# Patient Record
Sex: Female | Born: 1950 | Race: White | Hispanic: No | Marital: Married | Smoking: Never smoker
Health system: Southern US, Community
[De-identification: ages and names within clinical notes are randomized; demographics above are authoritative.]

## PROBLEM LIST (undated history)

## (undated) DIAGNOSIS — K219 Gastro-esophageal reflux disease without esophagitis: Secondary | ICD-10-CM

## (undated) DIAGNOSIS — D649 Anemia, unspecified: Secondary | ICD-10-CM

## (undated) DIAGNOSIS — M1990 Unspecified osteoarthritis, unspecified site: Secondary | ICD-10-CM

## (undated) DIAGNOSIS — M4802 Spinal stenosis, cervical region: Secondary | ICD-10-CM

## (undated) DIAGNOSIS — G43909 Migraine, unspecified, not intractable, without status migrainosus: Secondary | ICD-10-CM

## (undated) DIAGNOSIS — N6019 Diffuse cystic mastopathy of unspecified breast: Secondary | ICD-10-CM

## (undated) DIAGNOSIS — E538 Deficiency of other specified B group vitamins: Secondary | ICD-10-CM

## (undated) DIAGNOSIS — E669 Obesity, unspecified: Secondary | ICD-10-CM

## (undated) DIAGNOSIS — T7840XA Allergy, unspecified, initial encounter: Secondary | ICD-10-CM

## (undated) DIAGNOSIS — E785 Hyperlipidemia, unspecified: Secondary | ICD-10-CM

## (undated) DIAGNOSIS — F419 Anxiety disorder, unspecified: Secondary | ICD-10-CM

## (undated) HISTORY — PX: TUBAL LIGATION: SHX77

## (undated) HISTORY — DX: Unspecified osteoarthritis, unspecified site: M19.90

## (undated) HISTORY — DX: Anemia, unspecified: D64.9

## (undated) HISTORY — DX: Gastro-esophageal reflux disease without esophagitis: K21.9

## (undated) HISTORY — DX: Spinal stenosis, cervical region: M48.02

## (undated) HISTORY — DX: Allergy, unspecified, initial encounter: T78.40XA

## (undated) HISTORY — DX: Deficiency of other specified B group vitamins: E53.8

## (undated) HISTORY — DX: Diffuse cystic mastopathy of unspecified breast: N60.19

## (undated) HISTORY — DX: Migraine, unspecified, not intractable, without status migrainosus: G43.909

## (undated) HISTORY — DX: Obesity, unspecified: E66.9

## (undated) HISTORY — DX: Anxiety disorder, unspecified: F41.9

## (undated) HISTORY — PX: APPENDECTOMY: SHX54

## (undated) HISTORY — DX: Hyperlipidemia, unspecified: E78.5

---

## 1979-10-31 HISTORY — PX: CHOLECYSTECTOMY OPEN: SUR202

## 1986-10-30 HISTORY — PX: ABDOMINAL HYSTERECTOMY: SHX81

## 2000-01-05 ENCOUNTER — Other Ambulatory Visit: Admission: RE | Admit: 2000-01-05 | Discharge: 2000-01-05 | Payer: Self-pay | Admitting: Obstetrics and Gynecology

## 2001-09-05 ENCOUNTER — Encounter: Payer: Self-pay | Admitting: Family Medicine

## 2001-09-05 ENCOUNTER — Ambulatory Visit (HOSPITAL_COMMUNITY): Admission: RE | Admit: 2001-09-05 | Discharge: 2001-09-05 | Payer: Self-pay | Admitting: Family Medicine

## 2003-04-03 ENCOUNTER — Other Ambulatory Visit: Admission: RE | Admit: 2003-04-03 | Discharge: 2003-04-03 | Payer: Self-pay | Admitting: Obstetrics and Gynecology

## 2003-10-31 HISTORY — PX: KNEE ARTHROSCOPY: SUR90

## 2008-07-29 ENCOUNTER — Ambulatory Visit (HOSPITAL_COMMUNITY): Admission: RE | Admit: 2008-07-29 | Discharge: 2008-07-29 | Payer: Self-pay | Admitting: Family Medicine

## 2008-09-11 ENCOUNTER — Ambulatory Visit (HOSPITAL_COMMUNITY): Admission: RE | Admit: 2008-09-11 | Discharge: 2008-09-11 | Payer: Self-pay | Admitting: Orthopedic Surgery

## 2008-11-25 ENCOUNTER — Ambulatory Visit (HOSPITAL_BASED_OUTPATIENT_CLINIC_OR_DEPARTMENT_OTHER): Admission: RE | Admit: 2008-11-25 | Discharge: 2008-11-25 | Payer: Self-pay | Admitting: Orthopedic Surgery

## 2009-03-06 ENCOUNTER — Emergency Department (HOSPITAL_COMMUNITY): Admission: EM | Admit: 2009-03-06 | Discharge: 2009-03-07 | Payer: Self-pay | Admitting: Emergency Medicine

## 2009-12-24 IMAGING — CT CT HEAD W/O CM
1 series · 16 of 30 positions shown, 20 images · non-contrast
Comparison: None

CLINICAL DATA: Fell.  Headache.

CT HEAD WITHOUT CONTRAST
TECHNIQUE: Contiguous axial images were obtained from the base of
the skull through the vertex without contrast.

[Series 2: headtrauma 4.8 h37s · axial · 0.43mm/px · z∈[+298,+455]mm · 16 of 36 slices shown, 20 images]
[im 2/36  brain]
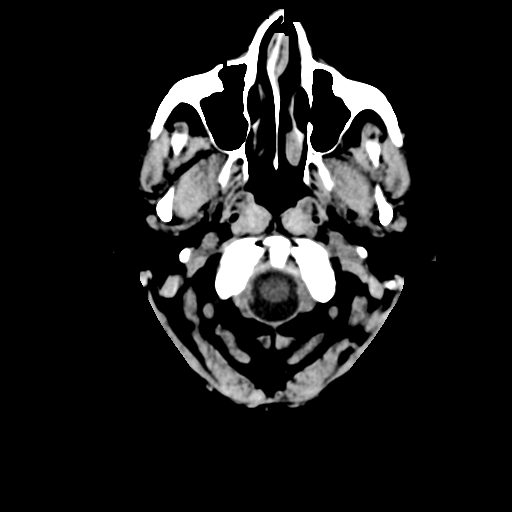
[im 2/36  bone]
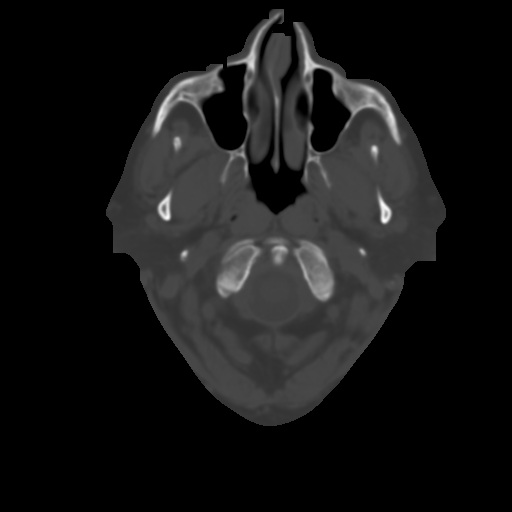
[im 4/36  brain]
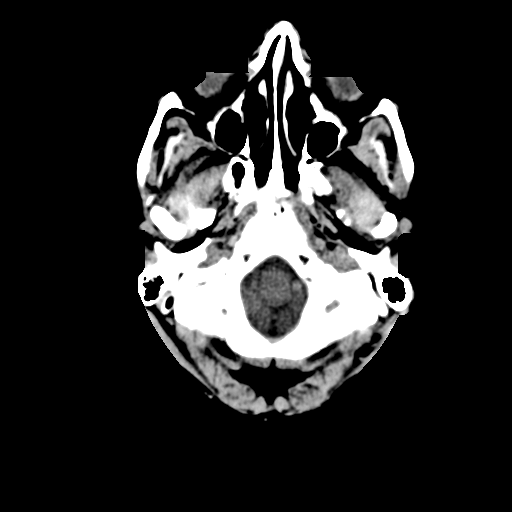
[im 7/36  brain]
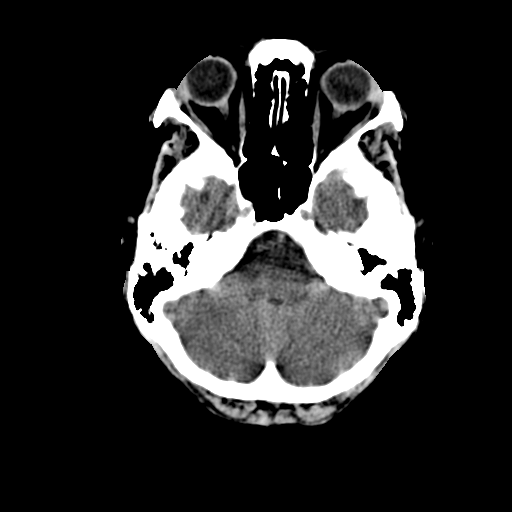
[im 9/36  brain]
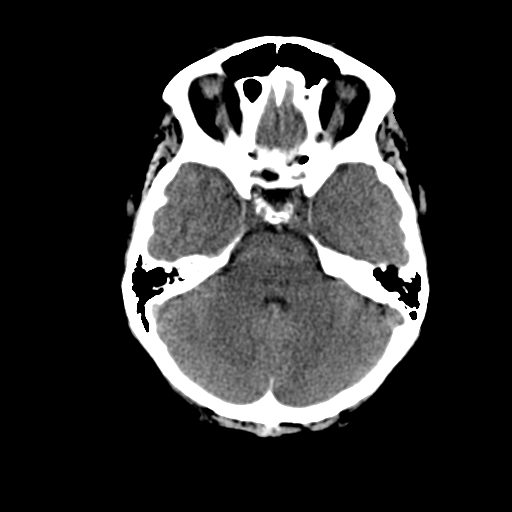
[im 10/36  brain]
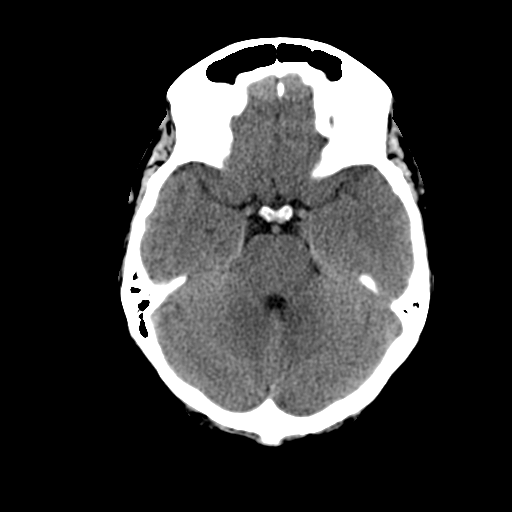
[im 10/36  bone]
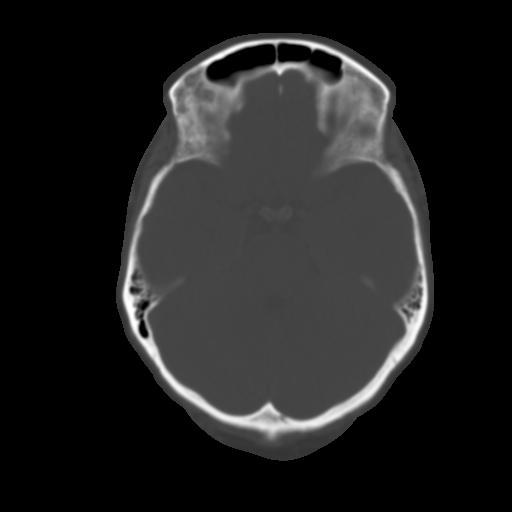
[im 13/36  brain]
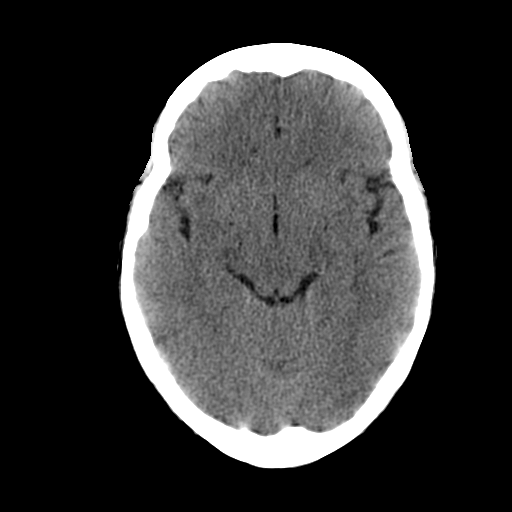
[im 15/36  brain]
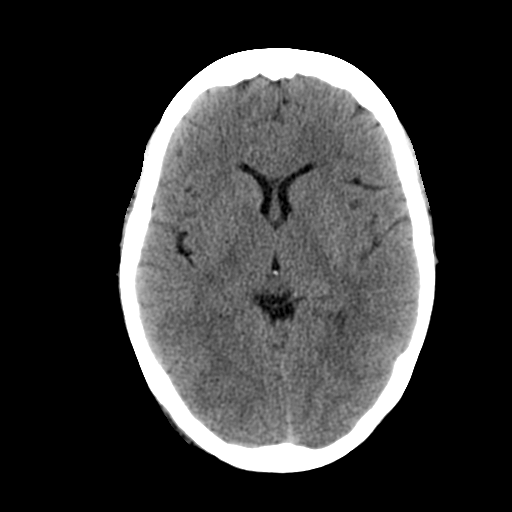
[im 17/36  brain]
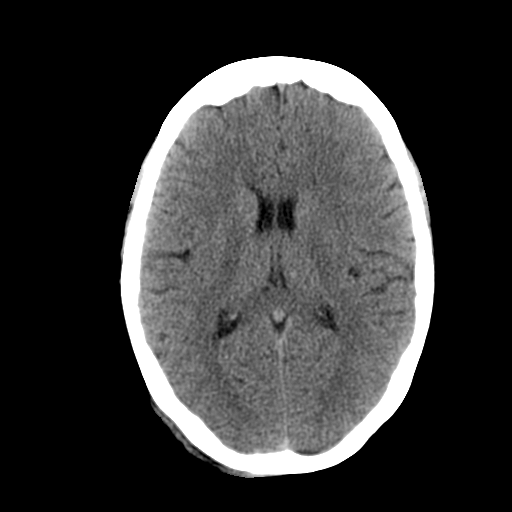
[im 19/36  brain]
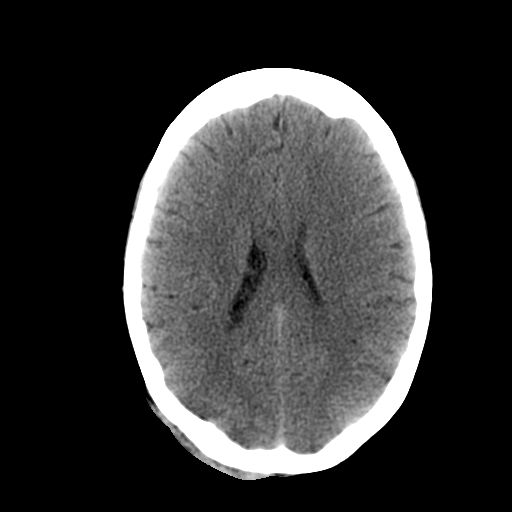
[im 19/36  bone]
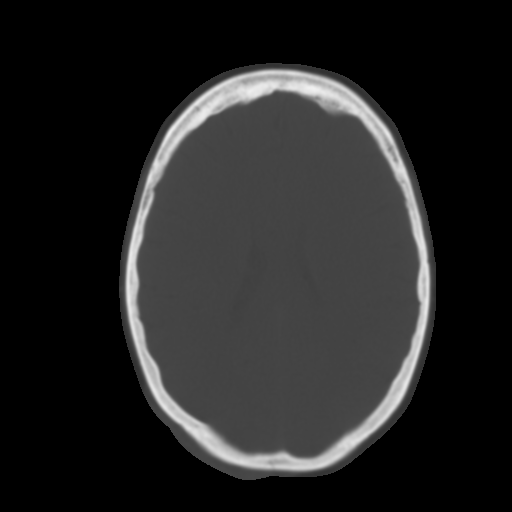
[im 21/36  brain]
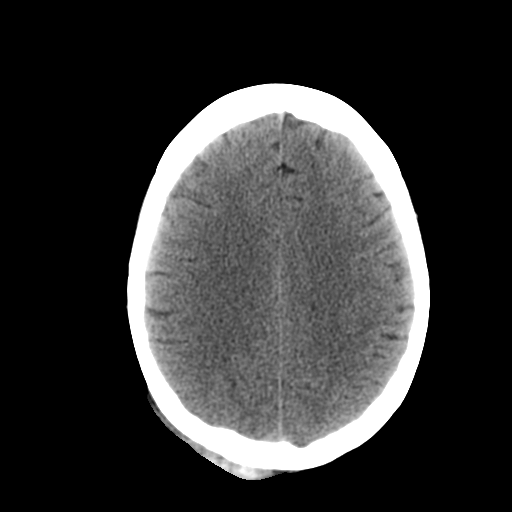
[im 23/36  brain]
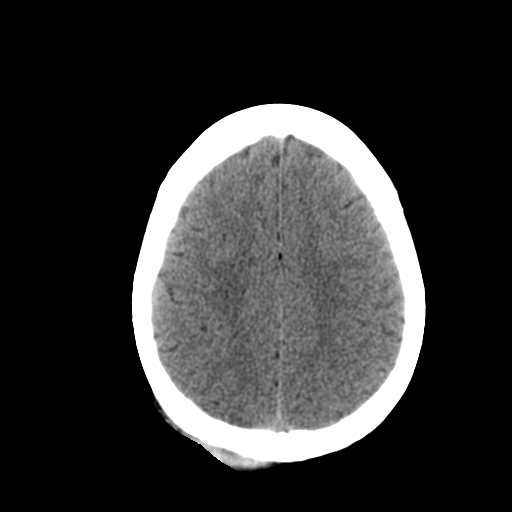
[im 26/36  brain]
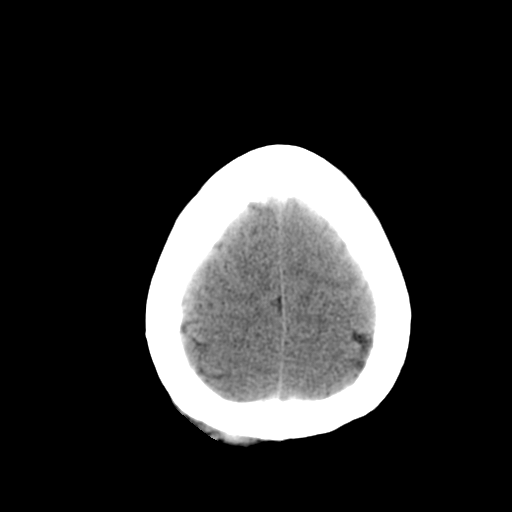
[im 27/36  brain]
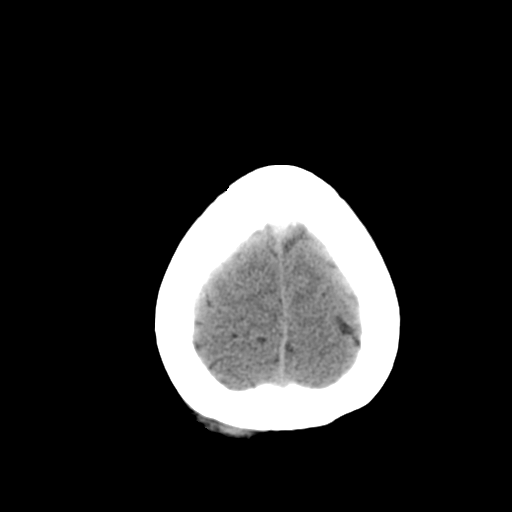
[im 27/36  bone]
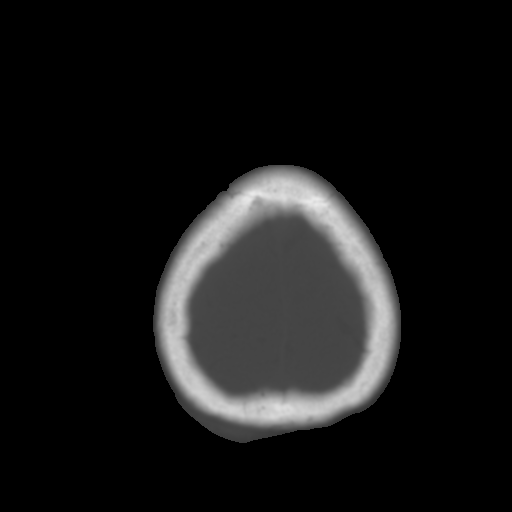
[im 29/36  brain]
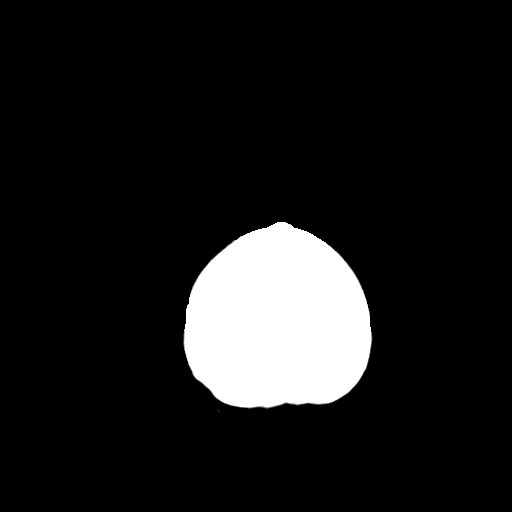
[im 32/36  brain]
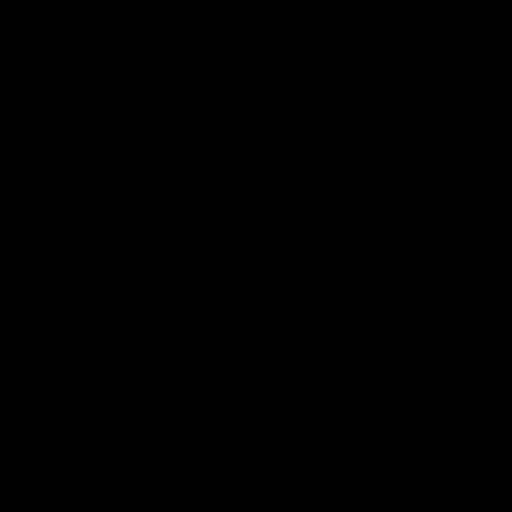
[im 34/36  brain]
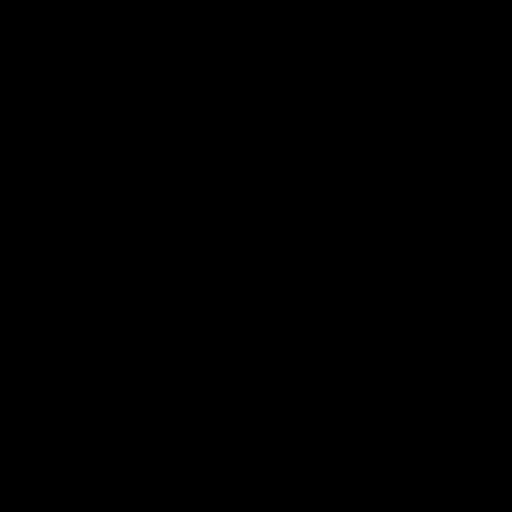

[16 of 30 positions shown; findings below may reference images not displayed]

FINDINGS: The ventricles are normal.  No extra-axial fluid
collections are seen.  The brainstem and cerebellum are
unremarkable.  No acute intracranial findings or mass lesions.

The bony calvarium is intact.  The visualized paranasal sinuses and
mastoid air cells are clear.

Right posterior parietal scalp hematoma.  No underlying skull
fracture.
IMPRESSION: 1.  No acute intracranial findings or mass lesions.
2.  Posterior parietal scalp hematoma without underlying skull
fracture.

## 2011-03-14 NOTE — Op Note (Signed)
Gina Patterson, Gina Patterson                 ACCOUNT NO.:  [id number]   MEDICAL RECORD NO.:  [id number]          PATIENT TYPE:  AMB   LOCATION:  [facility]                         FACILITY:  [facility]   PHYSICIAN:  Ollen Gross, M.D.    DATE OF BIRTH:  [birth year]   DATE OF PROCEDURE:  11/25/2008  DATE OF DISCHARGE:  11/25/2008                               OPERATIVE REPORT   PREOPERATIVE DIAGNOSIS:  Left knee medial meniscal tear.   POSTOPERATIVE DIAGNOSIS:  Left knee medial meniscal tear, plus medial  and lateral chondral defects.   PROCEDURE:  Left knee arthroscopy with medial meniscal debridement and  medial and lateral chondroplasty.   SURGEON:  Ollen Gross, M.D.   ANESTHESIA:  General.   ESTIMATED BLOOD LOSS:  Minimal.   DRAINS:  None.   COMPLICATIONS:  None.   CONDITION:  Stable to recovery room.   BRIEF CLINICAL NOTE:  Ms. Linden has had a long history of significant  left knee pain and mechanical symptoms.  These have gotten progressively  worse over time.  She did not respond well to injection and presents now  for left knee arthroscopy and debridement.   PROCEDURE IN DETAIL:  After successful administration of general  anesthetic, tourniquet was placed high on the left thigh and left lower  extremity prepped and draped in the usual sterile fashion.  Standard  superomedial and inferolateral incisions were made.  Inflow cannula  passed superomedial, camera passed inferolateral.  Arthroscopic  visualization proceeds.  Undersurface of the patella and trochlea show  minimal chondromalacia.  Medial and lateral gutters were visualized.  There were no loose bodies.  Flexion and valgus force was applied to the  knee and the medial compartment is entered.  The medial compartment does  show a chondral defect on the medial femoral condyle as well as a tear  in the body and posterior horn of the medial meniscus.  A spinal needle  is used to localize inferomedial portal, small incision made,  dilator  placed, and probe placed.  The meniscus debrided back to stable base  with baskets and a 4.2 mm shaver.  The chondral defect is debrided back  to a stable bony base with stable cartilaginous edges using the shaver.  Size of the chondral defect about 1 x 2 cm.  Intercondylar notch was  visualized and ACL looks normal.  Lateral compartment was entered and  there is a defect in the lateral femoral condyle about 1 x 1 cm.  The  cartilage was delaminating from bone.  This was debrided back to a  stable bony base with stable cartilaginous edges.  The rest of the  lateral compartment looks normal.  The joint was reinspected and there  were no other tears, loose bodies or chondral defects.  The arthroscopic  equipment was then removed from the  inferior portals which were closed with interrupted 4-0 nylon.  20 mL of  0.25% Marcaine with epi injected through the inflow cannula, then that  is removed and that portal closed with nylon.  Bulky sterile dressing  applied and she is awakened and  transferred to recovery in stable  condition.      Ollen Gross, M.D.  Electronically Signed     FA/MEDQ  D:  11/29/2008  T:  11/30/2008  Job:  [id number]

## 2012-06-28 LAB — HM PAP SMEAR: HM Pap smear: NEGATIVE

## 2012-06-30 HISTORY — PX: BREAST SURGERY: SHX581

## 2012-07-17 ENCOUNTER — Other Ambulatory Visit: Payer: Self-pay

## 2012-07-17 DIAGNOSIS — N6019 Diffuse cystic mastopathy of unspecified breast: Secondary | ICD-10-CM

## 2012-07-17 HISTORY — DX: Diffuse cystic mastopathy of unspecified breast: N60.19

## 2012-07-17 LAB — HM MAMMOGRAPHY

## 2013-01-28 ENCOUNTER — Other Ambulatory Visit: Payer: Self-pay | Admitting: Nurse Practitioner

## 2013-01-28 ENCOUNTER — Other Ambulatory Visit (INDEPENDENT_AMBULATORY_CARE_PROVIDER_SITE_OTHER): Payer: BC Managed Care – PPO

## 2013-01-28 DIAGNOSIS — E559 Vitamin D deficiency, unspecified: Secondary | ICD-10-CM

## 2013-01-29 LAB — VITAMIN D 25 HYDROXY (VIT D DEFICIENCY, FRACTURES): Vit D, 25-Hydroxy: 23 ng/mL — ABNORMAL LOW (ref 30–89)

## 2013-01-30 ENCOUNTER — Other Ambulatory Visit: Payer: Self-pay | Admitting: *Deleted

## 2013-01-30 DIAGNOSIS — E559 Vitamin D deficiency, unspecified: Secondary | ICD-10-CM

## 2013-01-30 MED ORDER — ERGOCALCIFEROL 1.25 MG (50000 UT) PO CAPS: 50000.0000 [IU] | ORAL_CAPSULE | ORAL | Status: DC

## 2013-07-08 ENCOUNTER — Encounter: Payer: Self-pay | Admitting: Nurse Practitioner

## 2013-07-09 ENCOUNTER — Ambulatory Visit (INDEPENDENT_AMBULATORY_CARE_PROVIDER_SITE_OTHER): Payer: BC Managed Care – PPO | Admitting: Nurse Practitioner

## 2013-07-09 ENCOUNTER — Encounter: Payer: Self-pay | Admitting: Nurse Practitioner

## 2013-07-09 VITALS — BP 136/92 | HR 80 | Resp 16 | Ht 65.75 in | Wt 230.0 lb

## 2013-07-09 DIAGNOSIS — Z01419 Encounter for gynecological examination (general) (routine) without abnormal findings: Secondary | ICD-10-CM

## 2013-07-09 DIAGNOSIS — E559 Vitamin D deficiency, unspecified: Secondary | ICD-10-CM | POA: Insufficient documentation

## 2013-07-09 DIAGNOSIS — Z0000 Encounter for general adult medical examination without abnormal findings: Secondary | ICD-10-CM

## 2013-07-09 DIAGNOSIS — R300 Dysuria: Secondary | ICD-10-CM

## 2013-07-09 DIAGNOSIS — E538 Deficiency of other specified B group vitamins: Secondary | ICD-10-CM

## 2013-07-09 LAB — COMPREHENSIVE METABOLIC PANEL
ALT: 20 U/L (ref 0–35)
AST: 19 U/L (ref 0–37)
CO2: 27 mEq/L (ref 19–32)
Chloride: 101 mEq/L (ref 96–112)
Creat: 0.73 mg/dL (ref 0.50–1.10)
Sodium: 135 mEq/L (ref 135–145)
Total Bilirubin: 0.5 mg/dL (ref 0.3–1.2)
Total Protein: 6.7 g/dL (ref 6.0–8.3)

## 2013-07-09 LAB — LIPID PANEL
Cholesterol: 187 mg/dL (ref 0–200)
Total CHOL/HDL Ratio: 4.7 Ratio

## 2013-07-09 LAB — HEMOGLOBIN A1C: Hgb A1c MFr Bld: 6.4 % — ABNORMAL HIGH (ref <5.7)

## 2013-07-09 LAB — HEMOGLOBIN, FINGERSTICK: Hemoglobin, fingerstick: 14.2 g/dL (ref 12.0–16.0)

## 2013-07-09 MED ORDER — NITROFURANTOIN MONOHYD MACRO 100 MG PO CAPS: 100.0000 mg | ORAL_CAPSULE | Freq: Two times a day (BID) | ORAL | Status: DC

## 2013-07-09 MED ORDER — PHENAZOPYRIDINE HCL 200 MG PO TABS: 200.0000 mg | ORAL_TABLET | Freq: Three times a day (TID) | ORAL | Status: DC | PRN

## 2013-07-09 NOTE — Progress Notes (Signed)
Patient ID: Gina Patterson, female   DOB: 01-17-1951, 62 y.o.   MRN: [id number] 62 y.o. G2P2002 Married Caucasian Fe here for annual exam.   Urinary symptoms since Monday am, with dysuria and urgency. Started on Pyridium and increase in fluids along with Macrobid on Monday.  She has now taken X 4 dose. Symptoms are better. Had left breast mass with needle core biopsy 07/17/12 showing FCB changes and apocrine metaplasia. One year follow up is due this month.  No LMP recorded. Patient is postmenopausal.          Sexually active: yes  The current method of family planning is status post hysterectomy.    Exercising: yes  walking twice a week Smoker:  no  Health Maintenance: Pap:  06/28/12, WNL HR HPV neg MMG:  07/16/12, biopsy; BI-Rads 2: benign findings Colonoscopy:  Never secondary to insurance coverage, PCP gives IFOB BMD:   07/11/12 T Score: spine 0.6/ left hip neck -1.5; total -0.2  TDaP:  UTD Labs: HB:  14.2 Urine: on pyridium     Past Medical History  Diagnosis Date  . Fibrocystic breast changes 07/17/12    Past Surgical History  Procedure Laterality Date  . Breast surgery Left 06/2012    fibrocystic breast changes    Current Outpatient Prescriptions  Medication Sig Dispense Refill  . ergocalciferol (VITAMIN D2) 50000 UNITS capsule Take 1 capsule (50,000 Units total) by mouth once a week.  26 capsule  1   No current facility-administered medications for this visit.    Family History  Problem Relation Age of Onset  . Lung cancer Mother 54  . CVA Father 40  . Lung cancer Sister 73  . Heart attack Brother   . Heart attack Brother     ROS:  Pertinent items are noted in HPI.  Otherwise, a comprehensive ROS was negative.  Exam:   BP 136/92  Pulse 80  Resp 16  Ht 5' 5.75" (1.67 m)  Wt 230 lb (104.327 kg)  BMI 37.41 kg/m2 Height: 5' 5.75" (167 cm)  Ht Readings from Last 3 Encounters:  07/09/13 5' 5.75" (1.67 m)    General appearance: alert, cooperative and appears stated  age Head: Normocephalic, without obvious abnormality, atraumatic Neck: no adenopathy, supple, symmetrical, trachea midline and thyroid normal to inspection and palpation Lungs: clear to auscultation bilaterally Breasts: normal appearance, no masses or tenderness, some slight tenderness at site of core biopsy without mass Heart: regular rate and rhythm Abdomen: soft, non-tender; no masses,  no organomegaly Extremities: extremities normal, atraumatic, no cyanosis or edema Skin: Skin color, texture, turgor normal. No rashes or lesions Lymph nodes: Cervical, supraclavicular, and axillary nodes normal. No abnormal inguinal nodes palpated Neurologic: Grossly normal   Pelvic: External genitalia:  no lesions              Urethra:  normal appearing urethra with no masses, tenderness or lesions              Bartholin's and Skene's: normal                 Vagina: normal appearing vagina with normal color and discharge, no lesions              Cervix: absent              Pap taken: no Bimanual Exam:  Uterus:  uterus absent              Adnexa: no mass, fullness, tenderness  Rectovaginal: Confirms               Anus:  normal sphincter tone, no lesions  A:  Well Woman with normal exam  S/P TAH secondary to fibroids and DUB 1988  S/P Left Breast Biopsy 06/2012 with FCB changes  Obesity, history  of Vit B 12  And vit D deficiency    P:   Pap smear as per guidelines - not indicated   Mammogram due 9/14  R/O UTI - partially treated and will complete therapy even if UA is negative.  Will follow with labs  Vit B 12 and rest of labs for patient to have for PCP appointment  Counseled on breast self exam, osteoporosis, adequate intake of calcium and vitamin D, diet and exercise, Kegel's exercises return annually or prn  An After Visit Summary was printed and given to the patient.

## 2013-07-09 NOTE — Patient Instructions (Signed)

## 2013-07-10 LAB — URINE CULTURE: Organism ID, Bacteria: NO GROWTH

## 2013-07-10 LAB — VITAMIN B12: Vitamin B-12: 370 pg/mL (ref 211–911)

## 2013-07-10 LAB — URINALYSIS, MICROSCOPIC ONLY
Bacteria, UA: NONE SEEN
Squamous Epithelial / LPF: NONE SEEN

## 2013-07-10 LAB — VITAMIN D 25 HYDROXY (VIT D DEFICIENCY, FRACTURES): Vit D, 25-Hydroxy: 37 ng/mL (ref 30–89)

## 2013-07-10 LAB — FOLATE: Folate: 7.2 ng/mL

## 2013-07-10 LAB — TSH: TSH: 5.017 u[IU]/mL — ABNORMAL HIGH (ref 0.350–4.500)

## 2013-07-10 NOTE — Progress Notes (Signed)
Encounter reviewed by Dr. Myron Lona Silva.  

## 2013-09-04 ENCOUNTER — Other Ambulatory Visit: Payer: Self-pay

## 2013-09-10 ENCOUNTER — Encounter: Payer: Self-pay | Admitting: Nurse Practitioner

## 2014-07-15 ENCOUNTER — Ambulatory Visit: Payer: BC Managed Care – PPO | Admitting: Nurse Practitioner

## 2014-07-16 ENCOUNTER — Ambulatory Visit (INDEPENDENT_AMBULATORY_CARE_PROVIDER_SITE_OTHER): Payer: BC Managed Care – PPO | Admitting: Nurse Practitioner

## 2014-07-16 ENCOUNTER — Encounter: Payer: Self-pay | Admitting: Nurse Practitioner

## 2014-07-16 ENCOUNTER — Other Ambulatory Visit: Payer: Self-pay | Admitting: Nurse Practitioner

## 2014-07-16 VITALS — BP 116/76 | HR 76 | Ht 65.5 in | Wt 235.0 lb

## 2014-07-16 DIAGNOSIS — M8580 Other specified disorders of bone density and structure, unspecified site: Secondary | ICD-10-CM

## 2014-07-16 DIAGNOSIS — Z01419 Encounter for gynecological examination (general) (routine) without abnormal findings: Secondary | ICD-10-CM

## 2014-07-16 DIAGNOSIS — M949 Disorder of cartilage, unspecified: Secondary | ICD-10-CM

## 2014-07-16 DIAGNOSIS — E538 Deficiency of other specified B group vitamins: Secondary | ICD-10-CM

## 2014-07-16 DIAGNOSIS — E559 Vitamin D deficiency, unspecified: Secondary | ICD-10-CM

## 2014-07-16 DIAGNOSIS — M899 Disorder of bone, unspecified: Secondary | ICD-10-CM

## 2014-07-16 DIAGNOSIS — Z0000 Encounter for general adult medical examination without abnormal findings: Secondary | ICD-10-CM

## 2014-07-16 DIAGNOSIS — R82998 Other abnormal findings in urine: Secondary | ICD-10-CM

## 2014-07-16 DIAGNOSIS — R8290 Unspecified abnormal findings in urine: Secondary | ICD-10-CM

## 2014-07-16 LAB — COMPREHENSIVE METABOLIC PANEL
ALK PHOS: 76 U/L (ref 39–117)
ALT: 21 U/L (ref 0–35)
AST: 16 U/L (ref 0–37)
Albumin: 4 g/dL (ref 3.5–5.2)
BILIRUBIN TOTAL: 0.5 mg/dL (ref 0.2–1.2)
BUN: 13 mg/dL (ref 6–23)
CALCIUM: 9 mg/dL (ref 8.4–10.5)
CHLORIDE: 102 meq/L (ref 96–112)
CO2: 25 mEq/L (ref 19–32)
CREATININE: 0.79 mg/dL (ref 0.50–1.10)
Glucose, Bld: 116 mg/dL — ABNORMAL HIGH (ref 70–99)
Potassium: 4.6 mEq/L (ref 3.5–5.3)
Sodium: 137 mEq/L (ref 135–145)
Total Protein: 6.5 g/dL (ref 6.0–8.3)

## 2014-07-16 LAB — HEMOGLOBIN A1C
Hgb A1c MFr Bld: 6.8 % — ABNORMAL HIGH (ref <5.7)
Mean Plasma Glucose: 148 mg/dL — ABNORMAL HIGH (ref <117)

## 2014-07-16 LAB — LIPID PANEL
CHOL/HDL RATIO: 4.2 ratio
CHOLESTEROL: 182 mg/dL (ref 0–200)
HDL: 43 mg/dL (ref >39)
LDL CALC: 97 mg/dL (ref 0–99)
TRIGLYCERIDES: 210 mg/dL — AB (ref <150)
VLDL: 42 mg/dL — AB (ref 0–40)

## 2014-07-16 LAB — POCT URINALYSIS DIPSTICK
BILIRUBIN UA: NEGATIVE
GLUCOSE UA: NEGATIVE
KETONES UA: NEGATIVE
Nitrite, UA: NEGATIVE
PH UA: 6
Protein, UA: NEGATIVE
RBC UA: NEGATIVE
Urobilinogen, UA: NEGATIVE

## 2014-07-16 LAB — HEMOGLOBIN, FINGERSTICK: Hemoglobin, fingerstick: 14.2 g/dL (ref 12.0–16.0)

## 2014-07-16 LAB — TSH: TSH: 3.804 u[IU]/mL (ref 0.350–4.500)

## 2014-07-16 MED ORDER — ERGOCALCIFEROL 1.25 MG (50000 UT) PO CAPS: 50000.0000 [IU] | ORAL_CAPSULE | ORAL | Status: DC

## 2014-07-16 NOTE — Patient Instructions (Signed)

## 2014-07-16 NOTE — Progress Notes (Signed)
Patient ID: Gina Patterson, female   DOB: 1951-01-06, 63 y.o.   MRN: [id number] 63 y.o. G41P2002 Married Caucasian Fe here for annual exam.  No new health problems.  Borderline elevated glucose. No UA symptoms.  Patient's last menstrual period was 07/31/1987.          Sexually active: yes   The current method of family planning is status post hysterectomy.     Exercising: yes  walking twice a week Smoker:  no  Health Maintenance: Pap:  06/28/12, WNL HR HPV neg, previous abnormal pap before hysterectomy MMG:  07/16/12, biopsy; BI-Rads 2: benign findings Colonoscopy:  Never secondary to insurance coverage, PCP gives IFOB BMD:   07/11/12 T Score: spine 0.6/ left hip neck -1.5; total -0.2   TDaP:  UTD Labs: HB:  14.2  Urine:  2+ leuk's   reports that she has never smoked. She has never used smokeless tobacco. She reports that she does not drink alcohol or use illicit drugs.  Past Medical History  Diagnosis Date  . Fibrocystic breast changes 07/17/12  . Migraine     with Aura  . Vitamin B12 deficiency     injestions monthly  . Obesity (BMI 35.0-39.9 without comorbidity)     Past Surgical History  Procedure Laterality Date  . Breast surgery Left 06/2012    fibrocystic breast changes  . Cholecystectomy open  1981  . Abdominal hysterectomy  1988    secondary to fibroids and DUB; ovaries remain    Current Outpatient Prescriptions  Medication Sig Dispense Refill  . butalbital-acetaminophen-caffeine (FIORICET, ESGIC) 50-325-40 MG per tablet Take 1 tablet by mouth 2 (two) times daily as needed for headache.      . naratriptan (AMERGE) 2.5 MG tablet Take one (1) tablet at onset of headache; if returns or does not resolve, may repeat after 4 hours; do not exceed five (5) mg in 24 hours.      Marland Kitchen omeprazole (PRILOSEC) 20 MG capsule Take 1 capsule by mouth daily.      Marland Kitchen VIIBRYD 20 MG TABS Take 1 tablet by mouth daily.      . ergocalciferol (VITAMIN D2) 50000 UNITS capsule Take 1 capsule (50,000  Units total) by mouth once a week.  26 capsule  1   No current facility-administered medications for this visit.    Family History  Problem Relation Age of Onset  . Lung cancer Mother 56  . CVA Father 23  . Lung cancer Sister 58  . Heart attack Brother   . Heart attack Brother   . Hypertension Sister     ROS:  Pertinent items are noted in HPI.  Otherwise, a comprehensive ROS was negative.  Exam:   BP 116/76  Pulse 76  Ht 5' 5.5" (1.664 m)  Wt 235 lb (106.595 kg)  BMI 38.50 kg/m2  LMP 07/31/1987 Height: 5' 5.5" (166.4 cm)  Ht Readings from Last 3 Encounters:  07/16/14 5' 5.5" (1.664 m)  07/09/13 5' 5.75" (1.67 m)    General appearance: alert, cooperative and appears stated age Head: Normocephalic, without obvious abnormality, atraumatic Neck: no adenopathy, supple, symmetrical, trachea midline and thyroid normal to inspection and palpation Lungs: clear to auscultation bilaterally Breasts: normal appearance, no masses or tenderness Heart: regular rate and rhythm Abdomen: soft, non-tender; no masses,  no organomegaly Extremities: extremities normal, atraumatic, no cyanosis or edema Skin: Skin color, texture, turgor normal. No rashes or lesions Lymph nodes: Cervical, supraclavicular, and axillary nodes normal. No abnormal inguinal nodes palpated  Neurologic: Grossly normal   Pelvic: External genitalia:  no lesions              Urethra:  normal appearing urethra with no masses, tenderness or lesions              Bartholin's and Skene's: normal                 Vagina: normal appearing vagina with normal color and discharge, no lesions              Cervix: absent              Pap taken: No. Bimanual Exam:  Uterus:  uterus absent              Adnexa: no mass, fullness, tenderness               Rectovaginal: Confirms               Anus:  normal sphincter tone, no lesions  A:  Well Woman with normal exam  S/P TAH secondary to fibroids and DUB 1988   S/P Left Breast Biopsy  06/2012 with FCB changes   Obesity, history of Vit B 12   And Vit D deficiency   Osteopenia   R/O UTI   P:   Reviewed health and wellness pertinent to exam  Pap smear not taken today  Mammogram is past due and will get that scheduled for her  Will repeat BMD  Follow with urine and labs  Refill on Vit D pending labs  Counseled on breast self exam, mammography screening, adequate intake of calcium and vitamin D, diet and exercise return annually or prn  An After Visit Summary was printed and given to the patient.

## 2014-07-17 ENCOUNTER — Other Ambulatory Visit: Payer: Self-pay | Admitting: Nurse Practitioner

## 2014-07-17 DIAGNOSIS — Z1231 Encounter for screening mammogram for malignant neoplasm of breast: Secondary | ICD-10-CM

## 2014-07-17 LAB — VITAMIN D 25 HYDROXY (VIT D DEFICIENCY, FRACTURES): VIT D 25 HYDROXY: 31 ng/mL (ref 30–89)

## 2014-07-18 LAB — FOLATE: Folate: 10.4 ng/mL

## 2014-07-18 LAB — VITAMIN B12: Vitamin B-12: 367 pg/mL (ref 211–911)

## 2014-07-19 ENCOUNTER — Other Ambulatory Visit: Payer: Self-pay | Admitting: Nurse Practitioner

## 2014-07-19 LAB — URINE CULTURE: Colony Count: >=100000

## 2014-07-19 MED ORDER — NITROFURANTOIN MONOHYD MACRO 100 MG PO CAPS: 100.0000 mg | ORAL_CAPSULE | Freq: Two times a day (BID) | ORAL | Status: DC

## 2014-07-19 NOTE — Progress Notes (Signed)
Encounter reviewed by Dr. Brook Silva.  

## 2014-07-22 ENCOUNTER — Telehealth: Payer: Self-pay | Admitting: *Deleted

## 2014-07-22 DIAGNOSIS — N390 Urinary tract infection, site not specified: Secondary | ICD-10-CM

## 2014-07-22 NOTE — Telephone Encounter (Signed)
Message copied by Graylon Good on Wed Jul 22, 2014 11:13 AM ------      Message from: Antonietta Barcelona      Created: Sun Jul 19, 2014  4:51 PM       Results via my chart:  Needs TOC and inquire if needs help with nutritionist or getting apt with PCP.            Izora Gala,  The urine culture shows an infection and you may take [place].  The RX is sent to your pharmacy and you may get RX today or on Monday am.  You will need a test of cure in 2 weeks.  You will need to call and make apt.  Colletta Maryland will also check with you.      The other labs with lipid panel shows an elevated triglyceride reading - to decrease this you must avoid:  cakes, cookies, pies, candy, whipped toppings and cream soups.  If you feel that you need a nutritionist to help you modify your diet - we can make an appointment for you.  The liver, kidney and thyroid test is normal.  The Vit D was on the low normal and I would have you to continue with your present dosing of Vit D.   The glucose was elevated along with the 3 month average of glucose - HGB AIC.  This was elevated and in the range of diagnostic for diabetes.  You will need to follow with PCP soon.  Vit B 12 level is not back yet and will be sent when available.       ------

## 2014-07-22 NOTE — Telephone Encounter (Signed)
Return call from patient.  Results reviewed. She will pick up [place] today.  TOC appt for 08/05/14.  Orders entered into EPIC.

## 2014-07-22 NOTE — Telephone Encounter (Signed)
I have attempted to contact this patient by phone with the following results: left message to return call to [place] at [phone] on answering machine (mobile per [facility]). [phone] (Mobile) Pt name was verified in message. Advised call was regarding results she reviewed on MyChart earlier today and to help her schedule appt.

## 2014-07-24 ENCOUNTER — Telehealth: Payer: Self-pay | Admitting: Nurse Practitioner

## 2014-07-24 NOTE — Telephone Encounter (Signed)
Order to [place] Cage, [place] desk for review and signature before fax to [place].

## 2014-07-24 NOTE — Telephone Encounter (Signed)
Noelle from the [facility] calling regarding referral for bone density and mammogram that was sent. Pt goes to [facility] and referral will need to be faxed there. She has notified pt of this.

## 2014-07-26 NOTE — Telephone Encounter (Signed)
Note for BMD already sent to [facility].  And she was asked to get Mammo.  Not sure about the question.

## 2014-07-27 NOTE — Telephone Encounter (Signed)
[facility] requires a paper order. Please review and sign order left on desk. Then I will fax for patient.

## 2014-07-28 NOTE — Telephone Encounter (Signed)
Spoke with patient. Advised order has been sent to [facility]. Patient is agreeable and will call to schedule.  Routing to provider for final review. Patient agreeable to disposition. Will close encounter

## 2014-08-05 ENCOUNTER — Encounter: Payer: Self-pay | Admitting: Nurse Practitioner

## 2014-08-05 ENCOUNTER — Ambulatory Visit (INDEPENDENT_AMBULATORY_CARE_PROVIDER_SITE_OTHER): Payer: BC Managed Care – PPO

## 2014-08-05 VITALS — BP 132/78 | HR 72 | Ht 65.5 in | Wt 230.0 lb

## 2014-08-05 DIAGNOSIS — N390 Urinary tract infection, site not specified: Secondary | ICD-10-CM

## 2014-08-05 NOTE — Progress Notes (Signed)
Pt came in for a nurse visit to give a urine TOC Pt states no sx and has finished antibiotics.  Culture sent to lab

## 2014-08-06 LAB — URINE CULTURE
Colony Count: NO GROWTH
ORGANISM ID, BACTERIA: NO GROWTH

## 2014-08-13 ENCOUNTER — Encounter: Payer: Self-pay | Admitting: Nurse Practitioner

## 2014-08-16 ENCOUNTER — Telehealth: Payer: Self-pay | Admitting: Nurse Practitioner

## 2014-08-16 NOTE — Telephone Encounter (Signed)
BMD dated 08/05/14 shows that spine measurement is normal.  In fact there has been an increase in BMD since 2013.  This most likely is from degenerative changes with a slight scoliosis.  The lowest reading is at the left femoral neck at -1.5 which is a decrease of -1.8% from 2013.  Right femoral neck T Score at -1.2 - no comparison from 2013.  She is in the osteopenic range and must start getting an increase in walking and upper body weights.    The FRAX Score for major fracture in the next 10 years is 8.1 % (Goal is < 20%), for hip fracture in next 10 years is 0.8% (goal is < 3%).  She must continue with calcium Vit D.  Recheck in 2 years.

## 2014-08-18 NOTE — Telephone Encounter (Signed)
I have attempted to contact this patient by phone with the following results: message left to return my call regarding labs with Golden Hurter, spouse, per 06/2014 DPR.

## 2014-08-18 NOTE — Telephone Encounter (Signed)
Pt notified of results per note below.  She is agreeable with this plan.  Pt does not take calcium supplement, but believes she gets at least 1200 mg through her diet.  Hardcopy of report sent to be scanned.

## 2014-08-31 ENCOUNTER — Encounter: Payer: Self-pay | Admitting: Nurse Practitioner

## 2014-10-09 ENCOUNTER — Telehealth: Payer: Self-pay | Admitting: Nurse Practitioner

## 2014-10-09 NOTE — Telephone Encounter (Signed)
Will mail release to patient and once received, will fax records per request.

## 2014-10-09 NOTE — Telephone Encounter (Signed)
Pt called requesting lab results from September sent to her primary Dr Marjean Donna. I told her we would need a signed release form and she insisted she had signed on previously.   West Carbo MD  Internist  [facility] Address: [address] [place], [place], [place] [address]  [phone]   Pt: [phone]

## 2015-07-19 ENCOUNTER — Encounter: Payer: Self-pay | Admitting: Nurse Practitioner

## 2015-07-19 ENCOUNTER — Ambulatory Visit (INDEPENDENT_AMBULATORY_CARE_PROVIDER_SITE_OTHER): Payer: 59 | Admitting: Nurse Practitioner

## 2015-07-19 VITALS — BP 118/72 | HR 72 | Resp 14 | Ht 65.5 in | Wt 234.0 lb

## 2015-07-19 DIAGNOSIS — M8580 Other specified disorders of bone density and structure, unspecified site: Secondary | ICD-10-CM | POA: Diagnosis not present

## 2015-07-19 DIAGNOSIS — Z01419 Encounter for gynecological examination (general) (routine) without abnormal findings: Secondary | ICD-10-CM | POA: Diagnosis not present

## 2015-07-19 DIAGNOSIS — Z0000 Encounter for general adult medical examination without abnormal findings: Secondary | ICD-10-CM

## 2015-07-19 DIAGNOSIS — N3001 Acute cystitis with hematuria: Secondary | ICD-10-CM

## 2015-07-19 LAB — POCT URINALYSIS DIPSTICK
Bilirubin, UA: NEGATIVE
Ketones, UA: NEGATIVE
NITRITE UA: NEGATIVE
PROTEIN UA: NEGATIVE
UROBILINOGEN UA: NEGATIVE
pH, UA: 5

## 2015-07-19 LAB — COMPREHENSIVE METABOLIC PANEL
ALK PHOS: 80 U/L (ref 33–130)
ALT: 16 U/L (ref 6–29)
AST: 16 U/L (ref 10–35)
Albumin: 4 g/dL (ref 3.6–5.1)
BUN: 14 mg/dL (ref 7–25)
CALCIUM: 9 mg/dL (ref 8.6–10.4)
CO2: 27 mmol/L (ref 20–31)
Chloride: 99 mmol/L (ref 98–110)
Creat: 0.78 mg/dL (ref 0.50–0.99)
Glucose, Bld: 106 mg/dL — ABNORMAL HIGH (ref 65–99)
POTASSIUM: 4.1 mmol/L (ref 3.5–5.3)
Sodium: 134 mmol/L — ABNORMAL LOW (ref 135–146)
TOTAL PROTEIN: 6.5 g/dL (ref 6.1–8.1)
Total Bilirubin: 0.4 mg/dL (ref 0.2–1.2)

## 2015-07-19 LAB — HEMOGLOBIN A1C
Hgb A1c MFr Bld: 6.5 % — ABNORMAL HIGH (ref <5.7)
Mean Plasma Glucose: 140 mg/dL — ABNORMAL HIGH (ref <117)

## 2015-07-19 LAB — LIPID PANEL
CHOL/HDL RATIO: 5.6 ratio — AB (ref <=5.0)
CHOLESTEROL: 185 mg/dL (ref 125–200)
HDL: 33 mg/dL — ABNORMAL LOW (ref >=46)
LDL Cholesterol: 105 mg/dL (ref <130)
TRIGLYCERIDES: 234 mg/dL — AB (ref <150)
VLDL: 47 mg/dL — AB (ref <30)

## 2015-07-19 LAB — VITAMIN B12: VITAMIN B 12: 525 pg/mL (ref 211–911)

## 2015-07-19 LAB — HEMOGLOBIN, FINGERSTICK: HEMOGLOBIN, FINGERSTICK: 13.6 g/dL (ref 12.0–16.0)

## 2015-07-19 LAB — TSH: TSH: 4.198 u[IU]/mL (ref 0.350–4.500)

## 2015-07-19 NOTE — Patient Instructions (Signed)

## 2015-07-19 NOTE — Progress Notes (Signed)
64 y.o. G33P2002 Married  Caucasian [name] here for annual exam.  Lost 20 lbs after last visit and HGB AIC went down to 6.1.  Now has gained some weight back.  Not on med's for DM just trying to do diet and exercise.  Did have some urinary urgency last week.  More problems with stress incontinence and would like to see Dr. Quincy Simmonds to see if anything can be done for this.  Patient's last menstrual period was 07/31/1987.          Sexually active: No.  The current method of family planning is none, postmenopausal.  Post hysterectomy  Exercising: Yes.    walk Smoker:  no  Health Maintenance: Pap:  06/28/12, wnl, HR HPV neg MMG:  08/05/14 Bi-rads C 1 neg. Colonoscopy:  Never, PCP gives IFOB BMD:   08/05/14 low bone mass TDaP:  08/10/2014 Labs: Drawn today  Hb:  UA: 13.6 WBC-++++, RBC-trace, pH-5.0, glucose-trace.   reports that she has never smoked. She has never used smokeless tobacco. She reports that she does not drink alcohol or use illicit drugs.  Past Medical History  Diagnosis Date  . Fibrocystic breast changes 07/17/12  . Migraine     with Aura  . Vitamin B12 deficiency     injestions monthly  . Obesity (BMI 35.0-39.9 without comorbidity)     Past Surgical History  Procedure Laterality Date  . Breast surgery Left 06/2012    fibrocystic breast changes  . Cholecystectomy open  1981  . Abdominal hysterectomy  1988    secondary to fibroids and DUB; ovaries remain    Current Outpatient Prescriptions  Medication Sig Dispense Refill  . butalbital-acetaminophen-caffeine (FIORICET, ESGIC) 50-325-40 MG per tablet Take 1 tablet by mouth 2 (two) times daily as needed for headache.    . Cyanocobalamin (VITAMIN B-12 IJ) Inject 10,000 mcg as directed every 30 (thirty) days.    . ergocalciferol (VITAMIN D2) 50000 UNITS capsule Take 1 capsule (50,000 Units total) by mouth once a week. 26 capsule 1  . naratriptan (AMERGE) 2.5 MG tablet Take one (1) tablet at onset of headache; if returns or does not  resolve, may repeat after 4 hours; do not exceed five (5) mg in 24 hours.    Marland Kitchen omeprazole (PRILOSEC) 20 MG capsule Take 1 capsule by mouth daily.    . sertraline (ZOLOFT) 50 MG tablet Take 50 mg by mouth daily.    Marland Kitchen gabapentin (NEURONTIN) 100 MG capsule      No current facility-administered medications for this visit.    Family History  Problem Relation Age of Onset  . Lung cancer Mother 65  . CVA Father 6  . Lung cancer Sister 21  . Heart attack Brother   . Heart attack Brother   . Hypertension Sister     ROS:  Pertinent items are noted in HPI.  Otherwise, a comprehensive ROS was negative.  Exam:   BP 118/72 mmHg  Pulse 72  Resp 14  Ht 5' 5.5" (1.664 m)  Wt 234 lb (106.142 kg)  BMI 38.33 kg/m2  LMP 07/31/1987 Height: 5' 5.5" (166.4 cm) Ht Readings from Last 3 Encounters:  07/19/15 5' 5.5" (1.664 m)  08/05/14 5' 5.5" (1.664 m)  07/16/14 5' 5.5" (1.664 m)    General appearance: alert, cooperative and appears stated age Head: Normocephalic, without obvious abnormality, atraumatic Neck: no adenopathy, supple, symmetrical, trachea midline and thyroid normal to inspection and palpation Lungs: clear to auscultation bilaterally Breasts: normal appearance, no masses or  tenderness Heart: regular rate and rhythm Abdomen: soft, non-tender; no masses,  no organomegaly Extremities: extremities normal, atraumatic, no cyanosis or edema Skin: Skin color, texture, turgor normal. No rashes or lesions Lymph nodes: Cervical, supraclavicular, and axillary nodes normal. No abnormal inguinal nodes palpated Neurologic: Grossly normal   Pelvic: External genitalia:  no lesions              Urethra:  normal appearing urethra with no masses, tenderness or lesions              Bartholin's and Skene's: normal                 Vagina: normal appearing vagina with normal color and discharge, no lesions.  On standing and valsalva there is pelvic floor relaxation.              Cervix: absent               Pap taken: Yes.   Bimanual Exam:  Uterus:  uterus absent              Adnexa: no mass, fullness, tenderness               Rectovaginal: Confirms               Anus:  normal sphincter tone, no lesions  Chaperone present: yes  A:  Well Woman with normal exam  S/P TAH secondary to fibroids and DUB 1988  S/P Left Breast Biopsy 06/2012 with FCB changes  Obesity, history of Vit B 12  Vit D deficiency  Osteopenia  R/O UTI  Borderline DM  P:   Reviewed health and wellness pertinent to exam  Pap smear as above  Mammogram is due 07/2015  Will follow with labs  Urine culture - not started on med's  Refill on Vit D 50000 IU weekly and will follow with labs   She will return to see Dr. Collier Flowers on breast self exam, mammography screening, adequate intake of calcium and vitamin D, diet and exercise, Kegel's exercises return annually or prn  An After Visit Summary was printed and given to the patient.

## 2015-07-20 ENCOUNTER — Telehealth: Payer: Self-pay | Admitting: Emergency Medicine

## 2015-07-20 LAB — VITAMIN D 25 HYDROXY (VIT D DEFICIENCY, FRACTURES): Vit D, 25-Hydroxy: 30 ng/mL (ref 30–100)

## 2015-07-20 LAB — URINE CULTURE: Colony Count: 2000

## 2015-07-20 NOTE — Telephone Encounter (Signed)
-----   Message from Kem Boroughs, [place] sent at 07/20/2015  8:34 AM EDT ----- Results via my chart:  Please call pt and review recommendations for clarity or help in rearranging apt with Dr. Quincy Simmonds.  Izora Gala,  The Vit D level is about the same as last year @ 31 vs. 30 this year.  Continue on RX Vit D weekly.  The thyroid level is still in the upper normal range and bears watching. Vit B 12 is better than past 2 years with your injections and is in the normal range.  The HGB AIC unfortunately has gone up from your previous 6.1 range.  Maybe gaining some weight back has influenced this as well.  Recommend getting back on your diet to reduce this number in order to stay off diabetic med's. Do you need a nutritional consult?  If so let me know.  The lipid panel shows an elevated triglyceride level - most likely because the glucose is elevated.  The HDL (good cholesterol) is down, exercise can bring this up.  The liver function test is normal but the kidney function test shows a low sodium - most likely because of fasting for your labs.  Be sure to increase fluids. Glucose is up as expected. The urine culture is not yet reported.  Because of the glucose & HGB AIC being elevated - I would postpone the visit with Dr. Quincy Simmonds as some of these symptoms of stress urinary incontinence is most likely aggravated by elevated glucose.  Once glucose levels are back in the normal range then arrange another visit.  The nurse will call you and review these recommendations with you for clarity.

## 2015-07-20 NOTE — Telephone Encounter (Signed)
Patient returned call. She read Kem Boroughs, FNP's message via Deloris Ping and is agreeable to instructions. She will follow up with PCP and call back to reschedule consult appointment with Dr. Quincy Simmonds.  Routing to provider for final review. Patient agreeable to disposition. Will close encounter.

## 2015-07-20 NOTE — Telephone Encounter (Signed)
Message left to return call to [place] at [phone].   Patient has read [employer].

## 2015-07-20 NOTE — Telephone Encounter (Signed)
Message left to return call to Tracy at [phone].    

## 2015-07-20 NOTE — Telephone Encounter (Signed)
Return call to Tracy. °

## 2015-07-21 LAB — IPS PAP TEST WITH HPV

## 2015-07-24 NOTE — Progress Notes (Signed)
Encounter reviewed by Dr. Brook Amundson C. Silva.  

## 2015-07-27 ENCOUNTER — Other Ambulatory Visit: Payer: Self-pay | Admitting: Nurse Practitioner

## 2015-07-27 NOTE — Telephone Encounter (Signed)
Medication refill request: Vit D 50,000units Last AEX:  07-19-15  Next AEX: 07-19-16 Last MMG (if hormonal medication request): 08-05-14 WNL Refill authorized: please advise

## 2015-08-04 ENCOUNTER — Ambulatory Visit: Payer: 59 | Admitting: Obstetrics and Gynecology

## 2015-08-17 ENCOUNTER — Encounter: Payer: Self-pay | Admitting: Nurse Practitioner

## 2016-02-23 DIAGNOSIS — G43909 Migraine, unspecified, not intractable, without status migrainosus: Secondary | ICD-10-CM | POA: Diagnosis not present

## 2016-03-17 DIAGNOSIS — L309 Dermatitis, unspecified: Secondary | ICD-10-CM | POA: Diagnosis not present

## 2016-03-20 DIAGNOSIS — R7301 Impaired fasting glucose: Secondary | ICD-10-CM | POA: Diagnosis not present

## 2016-03-20 DIAGNOSIS — N393 Stress incontinence (female) (male): Secondary | ICD-10-CM | POA: Diagnosis not present

## 2016-03-20 DIAGNOSIS — F419 Anxiety disorder, unspecified: Secondary | ICD-10-CM | POA: Diagnosis not present

## 2016-03-20 DIAGNOSIS — G43109 Migraine with aura, not intractable, without status migrainosus: Secondary | ICD-10-CM | POA: Diagnosis not present

## 2016-04-04 DIAGNOSIS — Z79899 Other long term (current) drug therapy: Secondary | ICD-10-CM | POA: Diagnosis not present

## 2016-04-04 DIAGNOSIS — Z13220 Encounter for screening for lipoid disorders: Secondary | ICD-10-CM | POA: Diagnosis not present

## 2016-04-04 DIAGNOSIS — E559 Vitamin D deficiency, unspecified: Secondary | ICD-10-CM | POA: Diagnosis not present

## 2016-04-04 DIAGNOSIS — D519 Vitamin B12 deficiency anemia, unspecified: Secondary | ICD-10-CM | POA: Diagnosis not present

## 2016-04-04 DIAGNOSIS — R7301 Impaired fasting glucose: Secondary | ICD-10-CM | POA: Diagnosis not present

## 2016-07-06 DIAGNOSIS — I10 Essential (primary) hypertension: Secondary | ICD-10-CM | POA: Diagnosis not present

## 2016-07-06 DIAGNOSIS — R7301 Impaired fasting glucose: Secondary | ICD-10-CM | POA: Diagnosis not present

## 2016-07-11 DIAGNOSIS — E1165 Type 2 diabetes mellitus with hyperglycemia: Secondary | ICD-10-CM | POA: Diagnosis not present

## 2016-07-11 DIAGNOSIS — F419 Anxiety disorder, unspecified: Secondary | ICD-10-CM | POA: Diagnosis not present

## 2016-07-11 DIAGNOSIS — G43109 Migraine with aura, not intractable, without status migrainosus: Secondary | ICD-10-CM | POA: Diagnosis not present

## 2016-07-11 DIAGNOSIS — D519 Vitamin B12 deficiency anemia, unspecified: Secondary | ICD-10-CM | POA: Diagnosis not present

## 2016-07-11 DIAGNOSIS — K219 Gastro-esophageal reflux disease without esophagitis: Secondary | ICD-10-CM | POA: Diagnosis not present

## 2016-07-11 DIAGNOSIS — G629 Polyneuropathy, unspecified: Secondary | ICD-10-CM | POA: Diagnosis not present

## 2016-07-11 DIAGNOSIS — M8580 Other specified disorders of bone density and structure, unspecified site: Secondary | ICD-10-CM | POA: Diagnosis not present

## 2016-07-11 DIAGNOSIS — E559 Vitamin D deficiency, unspecified: Secondary | ICD-10-CM | POA: Diagnosis not present

## 2016-07-19 ENCOUNTER — Ambulatory Visit: Payer: 59 | Admitting: Nurse Practitioner

## 2016-08-04 ENCOUNTER — Ambulatory Visit: Payer: Self-pay | Admitting: Nurse Practitioner

## 2016-08-04 ENCOUNTER — Telehealth: Payer: Self-pay | Admitting: Nurse Practitioner

## 2016-08-04 NOTE — Telephone Encounter (Signed)
Patient called back and will check into insurance options so that she may come back here for AEX.  She is most appreciative of our care.  She did not mean to come across as angry today but was frankly upset about her insurance.

## 2016-08-04 NOTE — Telephone Encounter (Signed)
Patient was left a message that we understand if insurance does not cover the visit.  But if she has a problem and needs to be seen that would be covered.  If needed to call us back.

## 2016-08-04 NOTE — Telephone Encounter (Signed)
Patient canceled appointment due to insurance issues.

## 2016-08-23 DIAGNOSIS — G43919 Migraine, unspecified, intractable, without status migrainosus: Secondary | ICD-10-CM | POA: Diagnosis not present

## 2016-08-23 DIAGNOSIS — E538 Deficiency of other specified B group vitamins: Secondary | ICD-10-CM | POA: Diagnosis not present

## 2016-08-23 DIAGNOSIS — R51 Headache: Secondary | ICD-10-CM | POA: Diagnosis not present

## 2016-08-25 DIAGNOSIS — E119 Type 2 diabetes mellitus without complications: Secondary | ICD-10-CM | POA: Diagnosis not present

## 2016-08-25 DIAGNOSIS — G43909 Migraine, unspecified, not intractable, without status migrainosus: Secondary | ICD-10-CM | POA: Diagnosis not present

## 2016-08-31 DIAGNOSIS — Z23 Encounter for immunization: Secondary | ICD-10-CM | POA: Diagnosis not present

## 2016-09-01 ENCOUNTER — Other Ambulatory Visit: Payer: Self-pay | Admitting: Nurse Practitioner

## 2016-09-13 DIAGNOSIS — Z1231 Encounter for screening mammogram for malignant neoplasm of breast: Secondary | ICD-10-CM | POA: Diagnosis not present

## 2016-09-13 DIAGNOSIS — M8589 Other specified disorders of bone density and structure, multiple sites: Secondary | ICD-10-CM | POA: Diagnosis not present

## 2016-09-15 ENCOUNTER — Encounter: Payer: Self-pay | Admitting: Nurse Practitioner

## 2016-09-26 ENCOUNTER — Telehealth: Payer: Self-pay | Admitting: Nurse Practitioner

## 2016-09-26 NOTE — Telephone Encounter (Signed)
Please let pt know that BMD shows a T score at the spine at +0.80; right hip neck at -1.10; left hip neck at -1.0; left radius at +0.2.  The lowest marker is the right hip but there was a 2% increase from last measurement in 08/05/2014.  This falls in the low normal range.  Continue with walking, calcium and Vit D support.  Good work on diet and exercise.

## 2016-10-04 DIAGNOSIS — J060 Acute laryngopharyngitis: Secondary | ICD-10-CM | POA: Diagnosis not present

## 2016-10-04 NOTE — Telephone Encounter (Signed)
Patient is notified of results.  She is excited results are normal.  She will continue weekly Vitamin D.  She will call with any further questions or discuss at next annual exam.

## 2016-10-04 NOTE — Telephone Encounter (Signed)
I have attempted to contact this patient by phone with the following results: left message to return my call on answering machine (mobile).  Advised call was regarding bone density results.  [phone] (Mobile) *Preferred*

## 2016-10-05 ENCOUNTER — Encounter: Payer: Self-pay | Admitting: Nurse Practitioner

## 2016-10-05 ENCOUNTER — Telehealth: Payer: Self-pay | Admitting: *Deleted

## 2016-10-05 NOTE — Telephone Encounter (Signed)
Kem Boroughs, NP forwarding FYI. Immunizations updated, replied to patient via [place].  From Gentry Fitz To Kem Boroughs, FNP Sent 10/05/2016 10:28 AM  Please have someone update my record. I had a flu shot on 07/11/2016 & a pneumonia shot 08/31/2016 with my family Dr. Wende Neighbors. Bone density was done with [facility] on 11//15/207. I am in the process of scheduling a colonoscopy in late December or early January. Thanks for your time.

## 2017-01-08 DIAGNOSIS — E559 Vitamin D deficiency, unspecified: Secondary | ICD-10-CM | POA: Diagnosis not present

## 2017-01-08 DIAGNOSIS — D519 Vitamin B12 deficiency anemia, unspecified: Secondary | ICD-10-CM | POA: Diagnosis not present

## 2017-01-08 DIAGNOSIS — E1165 Type 2 diabetes mellitus with hyperglycemia: Secondary | ICD-10-CM | POA: Diagnosis not present

## 2017-01-08 DIAGNOSIS — I10 Essential (primary) hypertension: Secondary | ICD-10-CM | POA: Diagnosis not present

## 2017-01-10 DIAGNOSIS — G43109 Migraine with aura, not intractable, without status migrainosus: Secondary | ICD-10-CM | POA: Diagnosis not present

## 2017-01-10 DIAGNOSIS — Z0000 Encounter for general adult medical examination without abnormal findings: Secondary | ICD-10-CM | POA: Diagnosis not present

## 2017-01-10 DIAGNOSIS — F419 Anxiety disorder, unspecified: Secondary | ICD-10-CM | POA: Diagnosis not present

## 2017-01-10 DIAGNOSIS — Z6831 Body mass index (BMI) 31.0-31.9, adult: Secondary | ICD-10-CM | POA: Diagnosis not present

## 2017-01-10 DIAGNOSIS — M8580 Other specified disorders of bone density and structure, unspecified site: Secondary | ICD-10-CM | POA: Diagnosis not present

## 2017-01-10 DIAGNOSIS — K449 Diaphragmatic hernia without obstruction or gangrene: Secondary | ICD-10-CM | POA: Diagnosis not present

## 2017-01-10 DIAGNOSIS — E1165 Type 2 diabetes mellitus with hyperglycemia: Secondary | ICD-10-CM | POA: Diagnosis not present

## 2017-01-10 DIAGNOSIS — K219 Gastro-esophageal reflux disease without esophagitis: Secondary | ICD-10-CM | POA: Diagnosis not present

## 2017-01-10 DIAGNOSIS — G629 Polyneuropathy, unspecified: Secondary | ICD-10-CM | POA: Diagnosis not present

## 2017-01-10 DIAGNOSIS — D519 Vitamin B12 deficiency anemia, unspecified: Secondary | ICD-10-CM | POA: Diagnosis not present

## 2017-03-21 DIAGNOSIS — G44209 Tension-type headache, unspecified, not intractable: Secondary | ICD-10-CM | POA: Diagnosis not present

## 2017-03-21 DIAGNOSIS — G43909 Migraine, unspecified, not intractable, without status migrainosus: Secondary | ICD-10-CM | POA: Diagnosis not present

## 2017-06-29 DIAGNOSIS — Z6832 Body mass index (BMI) 32.0-32.9, adult: Secondary | ICD-10-CM | POA: Diagnosis not present

## 2017-06-29 DIAGNOSIS — M25511 Pain in right shoulder: Secondary | ICD-10-CM | POA: Diagnosis not present

## 2017-07-16 DIAGNOSIS — E559 Vitamin D deficiency, unspecified: Secondary | ICD-10-CM | POA: Diagnosis not present

## 2017-07-16 DIAGNOSIS — D519 Vitamin B12 deficiency anemia, unspecified: Secondary | ICD-10-CM | POA: Diagnosis not present

## 2017-07-16 DIAGNOSIS — E1165 Type 2 diabetes mellitus with hyperglycemia: Secondary | ICD-10-CM | POA: Diagnosis not present

## 2017-07-18 DIAGNOSIS — E782 Mixed hyperlipidemia: Secondary | ICD-10-CM | POA: Diagnosis not present

## 2017-07-18 DIAGNOSIS — G43109 Migraine with aura, not intractable, without status migrainosus: Secondary | ICD-10-CM | POA: Diagnosis not present

## 2017-07-18 DIAGNOSIS — Z23 Encounter for immunization: Secondary | ICD-10-CM | POA: Diagnosis not present

## 2017-07-18 DIAGNOSIS — E1165 Type 2 diabetes mellitus with hyperglycemia: Secondary | ICD-10-CM | POA: Diagnosis not present

## 2017-07-18 DIAGNOSIS — K219 Gastro-esophageal reflux disease without esophagitis: Secondary | ICD-10-CM | POA: Diagnosis not present

## 2017-07-18 DIAGNOSIS — G8929 Other chronic pain: Secondary | ICD-10-CM | POA: Diagnosis not present

## 2017-07-18 DIAGNOSIS — G9009 Other idiopathic peripheral autonomic neuropathy: Secondary | ICD-10-CM | POA: Diagnosis not present

## 2017-07-18 DIAGNOSIS — M25512 Pain in left shoulder: Secondary | ICD-10-CM | POA: Diagnosis not present

## 2017-07-18 DIAGNOSIS — M8580 Other specified disorders of bone density and structure, unspecified site: Secondary | ICD-10-CM | POA: Diagnosis not present

## 2017-07-18 DIAGNOSIS — F419 Anxiety disorder, unspecified: Secondary | ICD-10-CM | POA: Diagnosis not present

## 2017-07-18 DIAGNOSIS — Z6833 Body mass index (BMI) 33.0-33.9, adult: Secondary | ICD-10-CM | POA: Diagnosis not present

## 2017-07-18 DIAGNOSIS — D519 Vitamin B12 deficiency anemia, unspecified: Secondary | ICD-10-CM | POA: Diagnosis not present

## 2017-07-25 ENCOUNTER — Encounter (INDEPENDENT_AMBULATORY_CARE_PROVIDER_SITE_OTHER): Payer: Self-pay | Admitting: *Deleted

## 2017-07-27 DIAGNOSIS — M19012 Primary osteoarthritis, left shoulder: Secondary | ICD-10-CM | POA: Diagnosis not present

## 2017-08-27 DIAGNOSIS — M19012 Primary osteoarthritis, left shoulder: Secondary | ICD-10-CM | POA: Diagnosis not present

## 2017-08-27 DIAGNOSIS — M75102 Unspecified rotator cuff tear or rupture of left shoulder, not specified as traumatic: Secondary | ICD-10-CM | POA: Diagnosis not present

## 2017-08-27 DIAGNOSIS — R200 Anesthesia of skin: Secondary | ICD-10-CM | POA: Diagnosis not present

## 2017-08-27 DIAGNOSIS — M542 Cervicalgia: Secondary | ICD-10-CM | POA: Diagnosis not present

## 2017-09-03 DIAGNOSIS — M542 Cervicalgia: Secondary | ICD-10-CM | POA: Diagnosis not present

## 2017-09-03 DIAGNOSIS — M19012 Primary osteoarthritis, left shoulder: Secondary | ICD-10-CM | POA: Diagnosis not present

## 2017-09-06 DIAGNOSIS — M4722 Other spondylosis with radiculopathy, cervical region: Secondary | ICD-10-CM | POA: Diagnosis not present

## 2017-09-06 DIAGNOSIS — M25561 Pain in right knee: Secondary | ICD-10-CM | POA: Diagnosis not present

## 2017-09-06 DIAGNOSIS — M19012 Primary osteoarthritis, left shoulder: Secondary | ICD-10-CM | POA: Diagnosis not present

## 2017-09-17 DIAGNOSIS — M19012 Primary osteoarthritis, left shoulder: Secondary | ICD-10-CM | POA: Diagnosis not present

## 2017-09-17 DIAGNOSIS — M542 Cervicalgia: Secondary | ICD-10-CM | POA: Diagnosis not present

## 2017-09-24 DIAGNOSIS — M4722 Other spondylosis with radiculopathy, cervical region: Secondary | ICD-10-CM | POA: Diagnosis not present

## 2017-09-25 DIAGNOSIS — G5600 Carpal tunnel syndrome, unspecified upper limb: Secondary | ICD-10-CM | POA: Diagnosis not present

## 2017-09-25 DIAGNOSIS — R51 Headache: Secondary | ICD-10-CM | POA: Diagnosis not present

## 2017-09-25 DIAGNOSIS — G44209 Tension-type headache, unspecified, not intractable: Secondary | ICD-10-CM | POA: Diagnosis not present

## 2017-09-25 DIAGNOSIS — F329 Major depressive disorder, single episode, unspecified: Secondary | ICD-10-CM | POA: Diagnosis not present

## 2017-10-05 DIAGNOSIS — Z1231 Encounter for screening mammogram for malignant neoplasm of breast: Secondary | ICD-10-CM | POA: Diagnosis not present

## 2017-12-12 DIAGNOSIS — F331 Major depressive disorder, recurrent, moderate: Secondary | ICD-10-CM | POA: Diagnosis not present

## 2017-12-12 DIAGNOSIS — F419 Anxiety disorder, unspecified: Secondary | ICD-10-CM | POA: Diagnosis not present

## 2017-12-12 DIAGNOSIS — Z5181 Encounter for therapeutic drug level monitoring: Secondary | ICD-10-CM | POA: Diagnosis not present

## 2017-12-25 DIAGNOSIS — M5030 Other cervical disc degeneration, unspecified cervical region: Secondary | ICD-10-CM | POA: Diagnosis not present

## 2017-12-25 DIAGNOSIS — M5412 Radiculopathy, cervical region: Secondary | ICD-10-CM | POA: Diagnosis not present

## 2018-01-07 DIAGNOSIS — E1165 Type 2 diabetes mellitus with hyperglycemia: Secondary | ICD-10-CM | POA: Diagnosis not present

## 2018-01-07 DIAGNOSIS — D519 Vitamin B12 deficiency anemia, unspecified: Secondary | ICD-10-CM | POA: Diagnosis not present

## 2018-01-07 DIAGNOSIS — E559 Vitamin D deficiency, unspecified: Secondary | ICD-10-CM | POA: Diagnosis not present

## 2018-01-11 DIAGNOSIS — K219 Gastro-esophageal reflux disease without esophagitis: Secondary | ICD-10-CM | POA: Diagnosis not present

## 2018-01-11 DIAGNOSIS — F419 Anxiety disorder, unspecified: Secondary | ICD-10-CM | POA: Diagnosis not present

## 2018-01-11 DIAGNOSIS — D519 Vitamin B12 deficiency anemia, unspecified: Secondary | ICD-10-CM | POA: Diagnosis not present

## 2018-01-11 DIAGNOSIS — E559 Vitamin D deficiency, unspecified: Secondary | ICD-10-CM | POA: Diagnosis not present

## 2018-01-11 DIAGNOSIS — G43119 Migraine with aura, intractable, without status migrainosus: Secondary | ICD-10-CM | POA: Diagnosis not present

## 2018-01-11 DIAGNOSIS — K449 Diaphragmatic hernia without obstruction or gangrene: Secondary | ICD-10-CM | POA: Diagnosis not present

## 2018-01-11 DIAGNOSIS — E782 Mixed hyperlipidemia: Secondary | ICD-10-CM | POA: Diagnosis not present

## 2018-01-11 DIAGNOSIS — E119 Type 2 diabetes mellitus without complications: Secondary | ICD-10-CM | POA: Diagnosis not present

## 2018-01-11 DIAGNOSIS — M25512 Pain in left shoulder: Secondary | ICD-10-CM | POA: Diagnosis not present

## 2018-01-11 DIAGNOSIS — G9009 Other idiopathic peripheral autonomic neuropathy: Secondary | ICD-10-CM | POA: Diagnosis not present

## 2018-01-11 DIAGNOSIS — F39 Unspecified mood [affective] disorder: Secondary | ICD-10-CM | POA: Diagnosis not present

## 2018-01-11 DIAGNOSIS — M8580 Other specified disorders of bone density and structure, unspecified site: Secondary | ICD-10-CM | POA: Diagnosis not present

## 2018-01-30 DIAGNOSIS — J019 Acute sinusitis, unspecified: Secondary | ICD-10-CM | POA: Diagnosis not present

## 2018-02-13 DIAGNOSIS — Z6835 Body mass index (BMI) 35.0-35.9, adult: Secondary | ICD-10-CM | POA: Diagnosis not present

## 2018-02-13 DIAGNOSIS — F419 Anxiety disorder, unspecified: Secondary | ICD-10-CM | POA: Diagnosis not present

## 2018-02-13 DIAGNOSIS — E559 Vitamin D deficiency, unspecified: Secondary | ICD-10-CM | POA: Diagnosis not present

## 2018-02-13 DIAGNOSIS — F331 Major depressive disorder, recurrent, moderate: Secondary | ICD-10-CM | POA: Diagnosis not present

## 2018-05-14 DIAGNOSIS — M25572 Pain in left ankle and joints of left foot: Secondary | ICD-10-CM | POA: Diagnosis not present

## 2018-05-14 DIAGNOSIS — M76822 Posterior tibial tendinitis, left leg: Secondary | ICD-10-CM | POA: Diagnosis not present

## 2018-05-14 DIAGNOSIS — M7742 Metatarsalgia, left foot: Secondary | ICD-10-CM | POA: Diagnosis not present

## 2018-05-14 DIAGNOSIS — M722 Plantar fascial fibromatosis: Secondary | ICD-10-CM | POA: Diagnosis not present

## 2018-05-14 DIAGNOSIS — M79672 Pain in left foot: Secondary | ICD-10-CM | POA: Diagnosis not present

## 2018-05-16 DIAGNOSIS — G43519 Persistent migraine aura without cerebral infarction, intractable, without status migrainosus: Secondary | ICD-10-CM | POA: Diagnosis not present

## 2018-05-16 DIAGNOSIS — F329 Major depressive disorder, single episode, unspecified: Secondary | ICD-10-CM | POA: Diagnosis not present

## 2018-05-16 DIAGNOSIS — N882 Stricture and stenosis of cervix uteri: Secondary | ICD-10-CM | POA: Diagnosis not present

## 2018-05-16 DIAGNOSIS — E538 Deficiency of other specified B group vitamins: Secondary | ICD-10-CM | POA: Diagnosis not present

## 2018-05-30 ENCOUNTER — Other Ambulatory Visit: Payer: Self-pay

## 2018-07-17 DIAGNOSIS — K219 Gastro-esophageal reflux disease without esophagitis: Secondary | ICD-10-CM | POA: Diagnosis not present

## 2018-07-17 DIAGNOSIS — M8550 Aneurysmal bone cyst, unspecified site: Secondary | ICD-10-CM | POA: Diagnosis not present

## 2018-07-17 DIAGNOSIS — K449 Diaphragmatic hernia without obstruction or gangrene: Secondary | ICD-10-CM | POA: Diagnosis not present

## 2018-07-17 DIAGNOSIS — N393 Stress incontinence (female) (male): Secondary | ICD-10-CM | POA: Diagnosis not present

## 2018-07-17 DIAGNOSIS — M8580 Other specified disorders of bone density and structure, unspecified site: Secondary | ICD-10-CM | POA: Diagnosis not present

## 2018-07-17 DIAGNOSIS — G43109 Migraine with aura, not intractable, without status migrainosus: Secondary | ICD-10-CM | POA: Diagnosis not present

## 2018-07-17 DIAGNOSIS — D519 Vitamin B12 deficiency anemia, unspecified: Secondary | ICD-10-CM | POA: Diagnosis not present

## 2018-07-17 DIAGNOSIS — F419 Anxiety disorder, unspecified: Secondary | ICD-10-CM | POA: Diagnosis not present

## 2018-07-17 DIAGNOSIS — E1165 Type 2 diabetes mellitus with hyperglycemia: Secondary | ICD-10-CM | POA: Diagnosis not present

## 2018-07-17 DIAGNOSIS — E559 Vitamin D deficiency, unspecified: Secondary | ICD-10-CM | POA: Diagnosis not present

## 2018-07-17 DIAGNOSIS — R7301 Impaired fasting glucose: Secondary | ICD-10-CM | POA: Diagnosis not present

## 2018-07-17 DIAGNOSIS — G629 Polyneuropathy, unspecified: Secondary | ICD-10-CM | POA: Diagnosis not present

## 2018-07-19 DIAGNOSIS — K219 Gastro-esophageal reflux disease without esophagitis: Secondary | ICD-10-CM | POA: Diagnosis not present

## 2018-07-19 DIAGNOSIS — E559 Vitamin D deficiency, unspecified: Secondary | ICD-10-CM | POA: Diagnosis not present

## 2018-07-19 DIAGNOSIS — M25562 Pain in left knee: Secondary | ICD-10-CM | POA: Diagnosis not present

## 2018-07-19 DIAGNOSIS — F39 Unspecified mood [affective] disorder: Secondary | ICD-10-CM | POA: Diagnosis not present

## 2018-07-19 DIAGNOSIS — E782 Mixed hyperlipidemia: Secondary | ICD-10-CM | POA: Diagnosis not present

## 2018-07-19 DIAGNOSIS — G43109 Migraine with aura, not intractable, without status migrainosus: Secondary | ICD-10-CM | POA: Diagnosis not present

## 2018-07-19 DIAGNOSIS — Z23 Encounter for immunization: Secondary | ICD-10-CM | POA: Diagnosis not present

## 2018-07-19 DIAGNOSIS — Z0001 Encounter for general adult medical examination with abnormal findings: Secondary | ICD-10-CM | POA: Diagnosis not present

## 2018-07-19 DIAGNOSIS — Z6836 Body mass index (BMI) 36.0-36.9, adult: Secondary | ICD-10-CM | POA: Diagnosis not present

## 2018-07-19 DIAGNOSIS — R7301 Impaired fasting glucose: Secondary | ICD-10-CM | POA: Diagnosis not present

## 2018-07-19 DIAGNOSIS — F419 Anxiety disorder, unspecified: Secondary | ICD-10-CM | POA: Diagnosis not present

## 2018-07-19 DIAGNOSIS — M25512 Pain in left shoulder: Secondary | ICD-10-CM | POA: Diagnosis not present

## 2018-07-30 DIAGNOSIS — M1712 Unilateral primary osteoarthritis, left knee: Secondary | ICD-10-CM | POA: Diagnosis not present

## 2018-07-30 DIAGNOSIS — M25562 Pain in left knee: Secondary | ICD-10-CM | POA: Diagnosis not present

## 2018-07-30 DIAGNOSIS — M170 Bilateral primary osteoarthritis of knee: Secondary | ICD-10-CM | POA: Diagnosis not present

## 2018-08-16 DIAGNOSIS — Z23 Encounter for immunization: Secondary | ICD-10-CM | POA: Diagnosis not present

## 2018-09-06 DIAGNOSIS — M1712 Unilateral primary osteoarthritis, left knee: Secondary | ICD-10-CM | POA: Diagnosis not present

## 2018-09-12 DIAGNOSIS — M1712 Unilateral primary osteoarthritis, left knee: Secondary | ICD-10-CM | POA: Diagnosis not present

## 2018-09-16 ENCOUNTER — Other Ambulatory Visit: Payer: Self-pay

## 2018-09-20 DIAGNOSIS — M1712 Unilateral primary osteoarthritis, left knee: Secondary | ICD-10-CM | POA: Diagnosis not present

## 2018-10-07 DIAGNOSIS — M8589 Other specified disorders of bone density and structure, multiple sites: Secondary | ICD-10-CM | POA: Diagnosis not present

## 2018-10-07 DIAGNOSIS — Z90710 Acquired absence of both cervix and uterus: Secondary | ICD-10-CM | POA: Diagnosis not present

## 2018-10-07 DIAGNOSIS — Z1231 Encounter for screening mammogram for malignant neoplasm of breast: Secondary | ICD-10-CM | POA: Diagnosis not present

## 2018-12-23 DIAGNOSIS — K219 Gastro-esophageal reflux disease without esophagitis: Secondary | ICD-10-CM | POA: Diagnosis not present

## 2018-12-23 DIAGNOSIS — R7301 Impaired fasting glucose: Secondary | ICD-10-CM | POA: Diagnosis not present

## 2018-12-23 DIAGNOSIS — M8580 Other specified disorders of bone density and structure, unspecified site: Secondary | ICD-10-CM | POA: Diagnosis not present

## 2018-12-23 DIAGNOSIS — D519 Vitamin B12 deficiency anemia, unspecified: Secondary | ICD-10-CM | POA: Diagnosis not present

## 2018-12-23 DIAGNOSIS — N393 Stress incontinence (female) (male): Secondary | ICD-10-CM | POA: Diagnosis not present

## 2018-12-23 DIAGNOSIS — M8550 Aneurysmal bone cyst, unspecified site: Secondary | ICD-10-CM | POA: Diagnosis not present

## 2018-12-23 DIAGNOSIS — F419 Anxiety disorder, unspecified: Secondary | ICD-10-CM | POA: Diagnosis not present

## 2018-12-23 DIAGNOSIS — E1165 Type 2 diabetes mellitus with hyperglycemia: Secondary | ICD-10-CM | POA: Diagnosis not present

## 2018-12-23 DIAGNOSIS — G629 Polyneuropathy, unspecified: Secondary | ICD-10-CM | POA: Diagnosis not present

## 2018-12-23 DIAGNOSIS — G43109 Migraine with aura, not intractable, without status migrainosus: Secondary | ICD-10-CM | POA: Diagnosis not present

## 2018-12-23 DIAGNOSIS — K449 Diaphragmatic hernia without obstruction or gangrene: Secondary | ICD-10-CM | POA: Diagnosis not present

## 2018-12-23 DIAGNOSIS — E559 Vitamin D deficiency, unspecified: Secondary | ICD-10-CM | POA: Diagnosis not present

## 2018-12-25 DIAGNOSIS — R7301 Impaired fasting glucose: Secondary | ICD-10-CM | POA: Diagnosis not present

## 2018-12-25 DIAGNOSIS — M8580 Other specified disorders of bone density and structure, unspecified site: Secondary | ICD-10-CM | POA: Diagnosis not present

## 2018-12-25 DIAGNOSIS — K219 Gastro-esophageal reflux disease without esophagitis: Secondary | ICD-10-CM | POA: Diagnosis not present

## 2018-12-25 DIAGNOSIS — F331 Major depressive disorder, recurrent, moderate: Secondary | ICD-10-CM | POA: Diagnosis not present

## 2018-12-25 DIAGNOSIS — E782 Mixed hyperlipidemia: Secondary | ICD-10-CM | POA: Diagnosis not present

## 2018-12-25 DIAGNOSIS — D519 Vitamin B12 deficiency anemia, unspecified: Secondary | ICD-10-CM | POA: Diagnosis not present

## 2018-12-25 DIAGNOSIS — M25512 Pain in left shoulder: Secondary | ICD-10-CM | POA: Diagnosis not present

## 2018-12-25 DIAGNOSIS — K449 Diaphragmatic hernia without obstruction or gangrene: Secondary | ICD-10-CM | POA: Diagnosis not present

## 2018-12-25 DIAGNOSIS — G9009 Other idiopathic peripheral autonomic neuropathy: Secondary | ICD-10-CM | POA: Diagnosis not present

## 2018-12-25 DIAGNOSIS — E559 Vitamin D deficiency, unspecified: Secondary | ICD-10-CM | POA: Diagnosis not present

## 2018-12-25 DIAGNOSIS — F419 Anxiety disorder, unspecified: Secondary | ICD-10-CM | POA: Diagnosis not present

## 2018-12-25 DIAGNOSIS — G43009 Migraine without aura, not intractable, without status migrainosus: Secondary | ICD-10-CM | POA: Diagnosis not present

## 2019-05-13 DIAGNOSIS — M542 Cervicalgia: Secondary | ICD-10-CM | POA: Diagnosis not present

## 2019-05-13 DIAGNOSIS — G43919 Migraine, unspecified, intractable, without status migrainosus: Secondary | ICD-10-CM | POA: Diagnosis not present

## 2019-06-23 DIAGNOSIS — D519 Vitamin B12 deficiency anemia, unspecified: Secondary | ICD-10-CM | POA: Diagnosis not present

## 2019-06-23 DIAGNOSIS — E559 Vitamin D deficiency, unspecified: Secondary | ICD-10-CM | POA: Diagnosis not present

## 2019-06-23 DIAGNOSIS — E1165 Type 2 diabetes mellitus with hyperglycemia: Secondary | ICD-10-CM | POA: Diagnosis not present

## 2019-06-23 DIAGNOSIS — E782 Mixed hyperlipidemia: Secondary | ICD-10-CM | POA: Diagnosis not present

## 2019-06-23 DIAGNOSIS — R7301 Impaired fasting glucose: Secondary | ICD-10-CM | POA: Diagnosis not present

## 2019-06-27 DIAGNOSIS — D519 Vitamin B12 deficiency anemia, unspecified: Secondary | ICD-10-CM | POA: Diagnosis not present

## 2019-06-27 DIAGNOSIS — E782 Mixed hyperlipidemia: Secondary | ICD-10-CM | POA: Diagnosis not present

## 2019-06-27 DIAGNOSIS — K219 Gastro-esophageal reflux disease without esophagitis: Secondary | ICD-10-CM | POA: Diagnosis not present

## 2019-06-27 DIAGNOSIS — E559 Vitamin D deficiency, unspecified: Secondary | ICD-10-CM | POA: Diagnosis not present

## 2019-06-27 DIAGNOSIS — M8580 Other specified disorders of bone density and structure, unspecified site: Secondary | ICD-10-CM | POA: Diagnosis not present

## 2019-06-27 DIAGNOSIS — G9009 Other idiopathic peripheral autonomic neuropathy: Secondary | ICD-10-CM | POA: Diagnosis not present

## 2019-06-27 DIAGNOSIS — F419 Anxiety disorder, unspecified: Secondary | ICD-10-CM | POA: Diagnosis not present

## 2019-06-27 DIAGNOSIS — R7303 Prediabetes: Secondary | ICD-10-CM | POA: Diagnosis not present

## 2019-06-27 DIAGNOSIS — M25512 Pain in left shoulder: Secondary | ICD-10-CM | POA: Diagnosis not present

## 2019-06-27 DIAGNOSIS — R7301 Impaired fasting glucose: Secondary | ICD-10-CM | POA: Diagnosis not present

## 2019-06-27 DIAGNOSIS — F331 Major depressive disorder, recurrent, moderate: Secondary | ICD-10-CM | POA: Diagnosis not present

## 2019-06-27 DIAGNOSIS — M1712 Unilateral primary osteoarthritis, left knee: Secondary | ICD-10-CM | POA: Diagnosis not present

## 2019-08-18 DIAGNOSIS — Z23 Encounter for immunization: Secondary | ICD-10-CM | POA: Diagnosis not present

## 2019-09-01 DIAGNOSIS — E119 Type 2 diabetes mellitus without complications: Secondary | ICD-10-CM | POA: Diagnosis not present

## 2019-09-01 DIAGNOSIS — G629 Polyneuropathy, unspecified: Secondary | ICD-10-CM | POA: Diagnosis not present

## 2019-09-01 DIAGNOSIS — K219 Gastro-esophageal reflux disease without esophagitis: Secondary | ICD-10-CM | POA: Diagnosis not present

## 2019-09-01 DIAGNOSIS — G9009 Other idiopathic peripheral autonomic neuropathy: Secondary | ICD-10-CM | POA: Diagnosis not present

## 2019-09-01 DIAGNOSIS — R7301 Impaired fasting glucose: Secondary | ICD-10-CM | POA: Diagnosis not present

## 2019-09-01 DIAGNOSIS — F419 Anxiety disorder, unspecified: Secondary | ICD-10-CM | POA: Diagnosis not present

## 2019-09-01 DIAGNOSIS — E782 Mixed hyperlipidemia: Secondary | ICD-10-CM | POA: Diagnosis not present

## 2019-09-01 DIAGNOSIS — D519 Vitamin B12 deficiency anemia, unspecified: Secondary | ICD-10-CM | POA: Diagnosis not present

## 2019-09-01 DIAGNOSIS — E1165 Type 2 diabetes mellitus with hyperglycemia: Secondary | ICD-10-CM | POA: Diagnosis not present

## 2019-09-01 DIAGNOSIS — E559 Vitamin D deficiency, unspecified: Secondary | ICD-10-CM | POA: Diagnosis not present

## 2019-09-01 DIAGNOSIS — G43109 Migraine with aura, not intractable, without status migrainosus: Secondary | ICD-10-CM | POA: Diagnosis not present

## 2019-09-01 DIAGNOSIS — N393 Stress incontinence (female) (male): Secondary | ICD-10-CM | POA: Diagnosis not present

## 2019-09-05 ENCOUNTER — Encounter: Payer: Self-pay | Admitting: Internal Medicine

## 2019-10-03 ENCOUNTER — Ambulatory Visit (AMBULATORY_SURGERY_CENTER): Payer: Medicare Other

## 2019-10-03 ENCOUNTER — Other Ambulatory Visit: Payer: Self-pay

## 2019-10-03 VITALS — Temp 96.9°F | Ht 65.5 in | Wt 227.0 lb

## 2019-10-03 DIAGNOSIS — Z1159 Encounter for screening for other viral diseases: Secondary | ICD-10-CM

## 2019-10-03 DIAGNOSIS — Z1211 Encounter for screening for malignant neoplasm of colon: Secondary | ICD-10-CM

## 2019-10-03 MED ORDER — NA SULFATE-K SULFATE-MG SULF 17.5-3.13-1.6 GM/177ML PO SOLN: 1.0000 | Freq: Once | ORAL | 0 refills | Status: AC

## 2019-10-03 NOTE — Progress Notes (Signed)

## 2019-10-08 DIAGNOSIS — J019 Acute sinusitis, unspecified: Secondary | ICD-10-CM | POA: Diagnosis not present

## 2019-10-08 DIAGNOSIS — R05 Cough: Secondary | ICD-10-CM | POA: Diagnosis not present

## 2019-10-14 ENCOUNTER — Other Ambulatory Visit (HOSPITAL_COMMUNITY)
Admission: RE | Admit: 2019-10-14 | Discharge: 2019-10-14 | Disposition: A | Discharge status: Home / Self Care | Payer: Medicare Other | Source: Ambulatory Visit | Attending: Internal Medicine | Admitting: Internal Medicine

## 2019-10-14 ENCOUNTER — Other Ambulatory Visit (HOSPITAL_COMMUNITY): Payer: Medicare Other

## 2019-10-17 ENCOUNTER — Encounter: Payer: Self-pay | Admitting: Internal Medicine

## 2019-10-23 ENCOUNTER — Encounter: Payer: Self-pay | Admitting: Internal Medicine

## 2019-10-31 HISTORY — PX: COLONOSCOPY: SHX174

## 2019-11-20 DIAGNOSIS — E1165 Type 2 diabetes mellitus with hyperglycemia: Secondary | ICD-10-CM | POA: Diagnosis not present

## 2019-11-20 DIAGNOSIS — K219 Gastro-esophageal reflux disease without esophagitis: Secondary | ICD-10-CM | POA: Diagnosis not present

## 2019-11-20 DIAGNOSIS — E7849 Other hyperlipidemia: Secondary | ICD-10-CM | POA: Diagnosis not present

## 2019-11-20 DIAGNOSIS — F419 Anxiety disorder, unspecified: Secondary | ICD-10-CM | POA: Diagnosis not present

## 2019-11-20 DIAGNOSIS — D519 Vitamin B12 deficiency anemia, unspecified: Secondary | ICD-10-CM | POA: Diagnosis not present

## 2019-11-20 DIAGNOSIS — R7301 Impaired fasting glucose: Secondary | ICD-10-CM | POA: Diagnosis not present

## 2019-11-20 DIAGNOSIS — G43109 Migraine with aura, not intractable, without status migrainosus: Secondary | ICD-10-CM | POA: Diagnosis not present

## 2019-11-20 DIAGNOSIS — N393 Stress incontinence (female) (male): Secondary | ICD-10-CM | POA: Diagnosis not present

## 2019-11-20 DIAGNOSIS — E119 Type 2 diabetes mellitus without complications: Secondary | ICD-10-CM | POA: Diagnosis not present

## 2019-11-20 DIAGNOSIS — F331 Major depressive disorder, recurrent, moderate: Secondary | ICD-10-CM | POA: Diagnosis not present

## 2019-11-20 DIAGNOSIS — G629 Polyneuropathy, unspecified: Secondary | ICD-10-CM | POA: Diagnosis not present

## 2019-11-20 DIAGNOSIS — E559 Vitamin D deficiency, unspecified: Secondary | ICD-10-CM | POA: Diagnosis not present

## 2019-11-27 ENCOUNTER — Other Ambulatory Visit (HOSPITAL_COMMUNITY)
Admission: RE | Admit: 2019-11-27 | Discharge: 2019-11-27 | Disposition: A | Discharge status: Home / Self Care | Payer: Medicare Other | Source: Ambulatory Visit | Attending: Internal Medicine | Admitting: Internal Medicine

## 2019-11-27 ENCOUNTER — Other Ambulatory Visit: Payer: Self-pay

## 2019-11-27 DIAGNOSIS — Z20822 Contact with and (suspected) exposure to covid-19: Secondary | ICD-10-CM | POA: Diagnosis not present

## 2019-11-27 DIAGNOSIS — Z01812 Encounter for preprocedural laboratory examination: Secondary | ICD-10-CM | POA: Diagnosis not present

## 2019-11-27 LAB — SARS CORONAVIRUS 2 (TAT 6-24 HRS): SARS Coronavirus 2: NEGATIVE

## 2019-12-02 ENCOUNTER — Encounter: Payer: Self-pay | Admitting: Internal Medicine

## 2019-12-02 ENCOUNTER — Other Ambulatory Visit: Payer: Self-pay

## 2019-12-02 ENCOUNTER — Ambulatory Visit (AMBULATORY_SURGERY_CENTER): Payer: Medicare Other | Admitting: Internal Medicine

## 2019-12-02 VITALS — BP 123/84 | HR 73 | Temp 95.3°F | Resp 13 | Ht 65.0 in | Wt 227.0 lb

## 2019-12-02 DIAGNOSIS — Z1211 Encounter for screening for malignant neoplasm of colon: Secondary | ICD-10-CM | POA: Diagnosis not present

## 2019-12-02 DIAGNOSIS — D123 Benign neoplasm of transverse colon: Secondary | ICD-10-CM | POA: Diagnosis not present

## 2019-12-02 DIAGNOSIS — D125 Benign neoplasm of sigmoid colon: Secondary | ICD-10-CM | POA: Diagnosis not present

## 2019-12-02 MED ORDER — SODIUM CHLORIDE 0.9 % IV SOLN: 500.0000 mL | Freq: Once | INTRAVENOUS | Status: DC

## 2019-12-02 NOTE — Progress Notes (Signed)
Pt tolerated well. VSS. Awake and to recovery. 

## 2019-12-02 NOTE — Op Note (Signed)
[facility] Patient Name: Gina Patterson Procedure Date: 12/02/2019 9:33 AM MRN: [id number] Endoscopist: Docia Chuck. Henrene Pastor , MD Age: 69 Referring MD:  Date of Birth: August 16, 1951 Gender: Female Account #: [id number] Procedure:                Colonoscopy with cold snare polypectomy x 5 Indications:              Screening for colorectal malignant neoplasm Medicines:                Monitored Anesthesia Care Procedure:                Pre-Anesthesia Assessment:                           - Prior to the procedure, a History and Physical                            was performed, and patient medications and                            allergies were reviewed. The patient's tolerance of                            previous anesthesia was also reviewed. The risks                            and benefits of the procedure and the sedation                            options and risks were discussed with the patient.                            All questions were answered, and informed consent                            was obtained. Prior Anticoagulants: The patient has                            taken no previous anticoagulant or antiplatelet                            agents. ASA Grade Assessment: II - A patient with                            mild systemic disease. After reviewing the risks                            and benefits, the patient was deemed in                            satisfactory condition to undergo the procedure.                           After obtaining informed consent, the colonoscope  was passed under direct vision. Throughout the                            procedure, the patient's blood pressure, pulse, and                            oxygen saturations were monitored continuously. The                            Colonoscope was introduced through the anus and                            advanced to the the cecum, identified by   appendiceal orifice and ileocecal valve. The                            ileocecal valve, appendiceal orifice, and rectum                            were photographed. The quality of the bowel                            preparation was excellent. The colonoscopy was                            performed without difficulty. The patient tolerated                            the procedure well. The bowel preparation used was                            SUPREP via split dose instruction. Scope In: 9:44:48 AM Scope Out: 10:06:01 AM Scope Withdrawal Time: 0 hours 16 minutes 59 seconds  Total Procedure Duration: 0 hours 21 minutes 13 seconds  Findings:                 Five polyps were found in the sigmoid colon and                            transverse colon. The polyps were 3 to 7 mm in                            size. These polyps were removed with a cold snare.                            Resection and retrieval were complete.                           The exam was otherwise without abnormality on                            direct and retroflexion views. Small internal  hemorrhoids present. Complications:            No immediate complications. Estimated blood loss:                            None. Estimated Blood Loss:     Estimated blood loss: none. Impression:               - Five 3 to 7 mm polyps in the sigmoid colon and in                            the transverse colon, removed with a cold snare.                            Resected and retrieved.                           - The examination was otherwise normal on direct                            and retroflexion views. Small internal hemorrhoids. Recommendation:           - Repeat colonoscopy in 3 years for surveillance.                           - Patient has a contact number available for                            emergencies. The signs and symptoms of potential                            delayed complications  were discussed with the                            patient. Return to normal activities tomorrow.                            Written discharge instructions were provided to the                            patient.                           - Resume previous diet.                           - Continue present medications.                           - Await pathology results. Docia Chuck. Henrene Pastor, MD 12/02/2019 10:12:39 AM This report has been signed electronically.

## 2019-12-02 NOTE — Progress Notes (Signed)
Called to room to assist during endoscopic procedure.  Patient ID and intended procedure confirmed with present staff. Received instructions for my participation in the procedure from the performing physician.  

## 2019-12-02 NOTE — Patient Instructions (Signed)
Handouts on polyps and hemorrhoids given to you today Await pathology results     YOU HAD AN ENDOSCOPIC PROCEDURE TODAY AT [facility]:   Refer to the procedure report that was given to you for any specific questions about what was found during the examination.  If the procedure report does not answer your questions, please call your gastroenterologist to clarify.  If you requested that your care partner not be given the details of your procedure findings, then the procedure report has been included in a sealed envelope for you to review at your convenience later.  YOU SHOULD EXPECT: Some feelings of bloating in the abdomen. Passage of more gas than usual.  Walking can help get rid of the air that was put into your GI tract during the procedure and reduce the bloating. If you had a lower endoscopy (such as a colonoscopy or flexible sigmoidoscopy) you may notice spotting of blood in your stool or on the toilet paper. If you underwent a bowel prep for your procedure, you may not have a normal bowel movement for a few days.  Please Note:  You might notice some irritation and congestion in your nose or some drainage.  This is from the oxygen used during your procedure.  There is no need for concern and it should clear up in a day or so.  SYMPTOMS TO REPORT IMMEDIATELY:   Following lower endoscopy (colonoscopy or flexible sigmoidoscopy):  Excessive amounts of blood in the stool  Significant tenderness or worsening of abdominal pains  Swelling of the abdomen that is new, acute  Fever of 100F or higher  For urgent or emergent issues, a gastroenterologist can be reached at any hour by calling [phone].   DIET:  We do recommend a small meal at first, but then you may proceed to your regular diet.  Drink plenty of fluids but you should avoid alcoholic beverages for 24 hours.  ACTIVITY:  You should plan to take it easy for the rest of today and you should NOT DRIVE or use heavy  machinery until tomorrow (because of the sedation medicines used during the test).    FOLLOW UP: Our staff will call the number listed on your records 48-72 hours following your procedure to check on you and address any questions or concerns that you may have regarding the information given to you following your procedure. If we do not reach you, we will leave a message.  We will attempt to reach you two times.  During this call, we will ask if you have developed any symptoms of COVID 19. If you develop any symptoms (ie: fever, flu-like symptoms, shortness of breath, cough etc.) before then, please call [phone].  If you test positive for Covid 19 in the 2 weeks post procedure, please call and report this information to [place].    If any biopsies were taken you will be contacted by phone or by letter within the next 1-3 weeks.  Please call us at [phone] if you have not heard about the biopsies in 3 weeks.    SIGNATURES/CONFIDENTIALITY: You and/or your care partner have signed paperwork which will be entered into your electronic medical record.  These signatures attest to the fact that that the information above on your After Visit Summary has been reviewed and is understood.  Full responsibility of the confidentiality of this discharge information lies with you and/or your care-partner.

## 2019-12-02 NOTE — Progress Notes (Signed)
Pt's states no medical or surgical changes since previsit or office visit. 

## 2019-12-04 ENCOUNTER — Encounter: Payer: Self-pay | Admitting: Internal Medicine

## 2019-12-04 ENCOUNTER — Telehealth: Payer: Self-pay

## 2019-12-04 NOTE — Telephone Encounter (Signed)
No answer, left message to call back later today, B.Jaryah Aracena RN. 

## 2019-12-04 NOTE — Telephone Encounter (Signed)
  Follow up Call-  Call back number 12/02/2019  Post procedure Call Back phone  # [phone]-husband cell ok to talk to hi.  Permission to leave phone message Yes  Some recent data might be hidden     Patient questions:  Do you have a fever, pain , or abdominal swelling? No. Pain Score  0 *  Have you tolerated food without any problems? Yes.    Have you been able to return to your normal activities? Yes.    Do you have any questions about your discharge instructions: Diet   No. Medications  No. Follow up visit  No.  Do you have questions or concerns about your Care? No.  Actions: * If pain score is 4 or above: No action needed, pain <4.  1. Have you developed a fever since your procedure? no  2.   Have you had an respiratory symptoms (SOB or cough) since your procedure? no  3.   Have you tested positive for COVID 19 since your procedure no  4.   Have you had any family members/close contacts diagnosed with the COVID 19 since your procedure?  no   If yes to any of these questions please route to Joylene John, RN and Alphonsa Gin, Therapist, sports.

## 2019-12-29 DIAGNOSIS — F419 Anxiety disorder, unspecified: Secondary | ICD-10-CM | POA: Diagnosis not present

## 2019-12-29 DIAGNOSIS — E1165 Type 2 diabetes mellitus with hyperglycemia: Secondary | ICD-10-CM | POA: Diagnosis not present

## 2019-12-29 DIAGNOSIS — M8580 Other specified disorders of bone density and structure, unspecified site: Secondary | ICD-10-CM | POA: Diagnosis not present

## 2019-12-29 DIAGNOSIS — K219 Gastro-esophageal reflux disease without esophagitis: Secondary | ICD-10-CM | POA: Diagnosis not present

## 2019-12-29 DIAGNOSIS — D519 Vitamin B12 deficiency anemia, unspecified: Secondary | ICD-10-CM | POA: Diagnosis not present

## 2019-12-29 DIAGNOSIS — R7301 Impaired fasting glucose: Secondary | ICD-10-CM | POA: Diagnosis not present

## 2019-12-29 DIAGNOSIS — E559 Vitamin D deficiency, unspecified: Secondary | ICD-10-CM | POA: Diagnosis not present

## 2019-12-29 DIAGNOSIS — G43109 Migraine with aura, not intractable, without status migrainosus: Secondary | ICD-10-CM | POA: Diagnosis not present

## 2019-12-29 DIAGNOSIS — K449 Diaphragmatic hernia without obstruction or gangrene: Secondary | ICD-10-CM | POA: Diagnosis not present

## 2019-12-29 DIAGNOSIS — M8550 Aneurysmal bone cyst, unspecified site: Secondary | ICD-10-CM | POA: Diagnosis not present

## 2019-12-29 DIAGNOSIS — N393 Stress incontinence (female) (male): Secondary | ICD-10-CM | POA: Diagnosis not present

## 2019-12-29 DIAGNOSIS — G629 Polyneuropathy, unspecified: Secondary | ICD-10-CM | POA: Diagnosis not present

## 2020-01-03 ENCOUNTER — Ambulatory Visit: Payer: Medicare Other | Attending: Internal Medicine

## 2020-01-03 DIAGNOSIS — Z23 Encounter for immunization: Secondary | ICD-10-CM | POA: Insufficient documentation

## 2020-01-03 NOTE — Progress Notes (Signed)
   Covid-19 Vaccination Clinic  Name:  Gina Patterson    MRN: [id number] DOB: 06-01-1951  01/03/2020  [name] was observed post Covid-19 immunization for 15 minutes without incident. She was provided with Vaccine Information Sheet and instruction to access the V-Safe system.   [name] was instructed to call 911 with any severe reactions post vaccine: Marland Kitchen Difficulty breathing  . Swelling of face and throat  . A fast heartbeat  . A bad rash all over body  . Dizziness and weakness   Immunizations Administered    Name Date Dose VIS Date Route   Moderna COVID-19 Vaccine 01/03/2020  9:21 AM 0.5 mL 09/30/2019 Intramuscular   Manufacturer: Moderna   Lot: [id number]   [id number]

## 2020-01-07 DIAGNOSIS — D519 Vitamin B12 deficiency anemia, unspecified: Secondary | ICD-10-CM | POA: Diagnosis not present

## 2020-01-07 DIAGNOSIS — Z0001 Encounter for general adult medical examination with abnormal findings: Secondary | ICD-10-CM | POA: Diagnosis not present

## 2020-01-07 DIAGNOSIS — G9009 Other idiopathic peripheral autonomic neuropathy: Secondary | ICD-10-CM | POA: Diagnosis not present

## 2020-01-07 DIAGNOSIS — K219 Gastro-esophageal reflux disease without esophagitis: Secondary | ICD-10-CM | POA: Diagnosis not present

## 2020-01-07 DIAGNOSIS — E782 Mixed hyperlipidemia: Secondary | ICD-10-CM | POA: Diagnosis not present

## 2020-01-07 DIAGNOSIS — E559 Vitamin D deficiency, unspecified: Secondary | ICD-10-CM | POA: Diagnosis not present

## 2020-01-07 DIAGNOSIS — M1712 Unilateral primary osteoarthritis, left knee: Secondary | ICD-10-CM | POA: Diagnosis not present

## 2020-01-07 DIAGNOSIS — M25512 Pain in left shoulder: Secondary | ICD-10-CM | POA: Diagnosis not present

## 2020-01-07 DIAGNOSIS — R05 Cough: Secondary | ICD-10-CM | POA: Diagnosis not present

## 2020-01-07 DIAGNOSIS — R7301 Impaired fasting glucose: Secondary | ICD-10-CM | POA: Diagnosis not present

## 2020-01-07 DIAGNOSIS — J019 Acute sinusitis, unspecified: Secondary | ICD-10-CM | POA: Diagnosis not present

## 2020-01-07 DIAGNOSIS — F331 Major depressive disorder, recurrent, moderate: Secondary | ICD-10-CM | POA: Diagnosis not present

## 2020-01-07 DIAGNOSIS — R7303 Prediabetes: Secondary | ICD-10-CM | POA: Diagnosis not present

## 2020-01-07 DIAGNOSIS — F419 Anxiety disorder, unspecified: Secondary | ICD-10-CM | POA: Diagnosis not present

## 2020-01-07 DIAGNOSIS — M8580 Other specified disorders of bone density and structure, unspecified site: Secondary | ICD-10-CM | POA: Diagnosis not present

## 2020-02-04 ENCOUNTER — Ambulatory Visit: Payer: Medicare Other | Attending: Internal Medicine

## 2020-02-04 DIAGNOSIS — Z23 Encounter for immunization: Secondary | ICD-10-CM

## 2020-02-04 NOTE — Progress Notes (Signed)
   Covid-19 Vaccination Clinic  Name:  Gina Patterson    MRN: [id number] DOB: [birth year]  02/04/2020  [name] was observed post Covid-19 immunization for 15 minutes without incident. She was provided with Vaccine Information Sheet and instruction to access the V-Safe system.   [name] was instructed to call 911 with any severe reactions post vaccine: Marland Kitchen Difficulty breathing  . Swelling of face and throat  . A fast heartbeat  . A bad rash all over body  . Dizziness and weakness   Immunizations Administered    Name Date Dose VIS Date Route   Moderna COVID-19 Vaccine 02/04/2020 11:58 AM 0.5 mL 09/30/2019 Intramuscular   Manufacturer: Levan Hurst   LotFY:[id number]   [place]:[id number]

## 2020-05-05 DIAGNOSIS — Z8249 Family history of ischemic heart disease and other diseases of the circulatory system: Secondary | ICD-10-CM | POA: Diagnosis not present

## 2020-05-05 DIAGNOSIS — E7800 Pure hypercholesterolemia, unspecified: Secondary | ICD-10-CM | POA: Diagnosis not present

## 2020-05-05 DIAGNOSIS — Z1379 Encounter for other screening for genetic and chromosomal anomalies: Secondary | ICD-10-CM | POA: Diagnosis not present

## 2020-05-11 DIAGNOSIS — Z8249 Family history of ischemic heart disease and other diseases of the circulatory system: Secondary | ICD-10-CM | POA: Diagnosis not present

## 2020-05-11 DIAGNOSIS — E7800 Pure hypercholesterolemia, unspecified: Secondary | ICD-10-CM | POA: Diagnosis not present

## 2020-05-11 DIAGNOSIS — Z1379 Encounter for other screening for genetic and chromosomal anomalies: Secondary | ICD-10-CM | POA: Diagnosis not present

## 2020-05-12 DIAGNOSIS — F329 Major depressive disorder, single episode, unspecified: Secondary | ICD-10-CM | POA: Diagnosis not present

## 2020-05-12 DIAGNOSIS — G43109 Migraine with aura, not intractable, without status migrainosus: Secondary | ICD-10-CM | POA: Diagnosis not present

## 2020-05-12 DIAGNOSIS — G44209 Tension-type headache, unspecified, not intractable: Secondary | ICD-10-CM | POA: Diagnosis not present

## 2020-06-29 DIAGNOSIS — L601 Onycholysis: Secondary | ICD-10-CM | POA: Diagnosis not present

## 2020-06-29 DIAGNOSIS — L03031 Cellulitis of right toe: Secondary | ICD-10-CM | POA: Diagnosis not present

## 2020-08-02 ENCOUNTER — Ambulatory Visit: Payer: Medicare Other | Admitting: Podiatry

## 2020-08-05 DIAGNOSIS — E1165 Type 2 diabetes mellitus with hyperglycemia: Secondary | ICD-10-CM | POA: Diagnosis not present

## 2020-08-05 DIAGNOSIS — G43109 Migraine with aura, not intractable, without status migrainosus: Secondary | ICD-10-CM | POA: Diagnosis not present

## 2020-08-05 DIAGNOSIS — K219 Gastro-esophageal reflux disease without esophagitis: Secondary | ICD-10-CM | POA: Diagnosis not present

## 2020-08-05 DIAGNOSIS — G629 Polyneuropathy, unspecified: Secondary | ICD-10-CM | POA: Diagnosis not present

## 2020-08-05 DIAGNOSIS — D519 Vitamin B12 deficiency anemia, unspecified: Secondary | ICD-10-CM | POA: Diagnosis not present

## 2020-08-05 DIAGNOSIS — M8580 Other specified disorders of bone density and structure, unspecified site: Secondary | ICD-10-CM | POA: Diagnosis not present

## 2020-08-05 DIAGNOSIS — R7301 Impaired fasting glucose: Secondary | ICD-10-CM | POA: Diagnosis not present

## 2020-08-05 DIAGNOSIS — E559 Vitamin D deficiency, unspecified: Secondary | ICD-10-CM | POA: Diagnosis not present

## 2020-08-05 DIAGNOSIS — N393 Stress incontinence (female) (male): Secondary | ICD-10-CM | POA: Diagnosis not present

## 2020-08-05 DIAGNOSIS — F419 Anxiety disorder, unspecified: Secondary | ICD-10-CM | POA: Diagnosis not present

## 2020-08-05 DIAGNOSIS — K449 Diaphragmatic hernia without obstruction or gangrene: Secondary | ICD-10-CM | POA: Diagnosis not present

## 2020-08-05 DIAGNOSIS — M8550 Aneurysmal bone cyst, unspecified site: Secondary | ICD-10-CM | POA: Diagnosis not present

## 2020-08-13 DIAGNOSIS — F419 Anxiety disorder, unspecified: Secondary | ICD-10-CM | POA: Diagnosis not present

## 2020-08-13 DIAGNOSIS — G43009 Migraine without aura, not intractable, without status migrainosus: Secondary | ICD-10-CM | POA: Diagnosis not present

## 2020-08-13 DIAGNOSIS — E559 Vitamin D deficiency, unspecified: Secondary | ICD-10-CM | POA: Diagnosis not present

## 2020-08-13 DIAGNOSIS — F331 Major depressive disorder, recurrent, moderate: Secondary | ICD-10-CM | POA: Diagnosis not present

## 2020-08-13 DIAGNOSIS — M25512 Pain in left shoulder: Secondary | ICD-10-CM | POA: Diagnosis not present

## 2020-08-13 DIAGNOSIS — E782 Mixed hyperlipidemia: Secondary | ICD-10-CM | POA: Diagnosis not present

## 2020-08-13 DIAGNOSIS — Z6837 Body mass index (BMI) 37.0-37.9, adult: Secondary | ICD-10-CM | POA: Diagnosis not present

## 2020-08-13 DIAGNOSIS — K219 Gastro-esophageal reflux disease without esophagitis: Secondary | ICD-10-CM | POA: Diagnosis not present

## 2020-08-13 DIAGNOSIS — G9009 Other idiopathic peripheral autonomic neuropathy: Secondary | ICD-10-CM | POA: Diagnosis not present

## 2020-08-13 DIAGNOSIS — M8580 Other specified disorders of bone density and structure, unspecified site: Secondary | ICD-10-CM | POA: Diagnosis not present

## 2020-08-13 DIAGNOSIS — Z23 Encounter for immunization: Secondary | ICD-10-CM | POA: Diagnosis not present

## 2020-08-23 ENCOUNTER — Ambulatory Visit (INDEPENDENT_AMBULATORY_CARE_PROVIDER_SITE_OTHER): Payer: Medicare Other | Admitting: Podiatry

## 2020-08-23 ENCOUNTER — Other Ambulatory Visit: Payer: Self-pay

## 2020-08-23 DIAGNOSIS — L603 Nail dystrophy: Secondary | ICD-10-CM

## 2020-08-23 DIAGNOSIS — B351 Tinea unguium: Secondary | ICD-10-CM | POA: Diagnosis not present

## 2020-08-23 NOTE — Progress Notes (Signed)
   HPI: 69 y.o. female presenting today as a new patient for evaluation of discoloration and thickening to the right hallux nail plate.  Patient states that she does have history of trauma and injury to the right hallux nail plate.  Most recently she had an acrylic nail applied to the right hallux nail plate.  After taking the acrylic nail off it was very dystrophic.  She presents for further treatment and evaluation  Past Medical History:  Diagnosis Date  . Allergy   . Anemia    pernicious anemia  . Anxiety   . Arthritis   . Fibrocystic breast changes 07/17/12  . GERD (gastroesophageal reflux disease)   . Hyperlipidemia   . Migraine    with Aura  . Obesity (BMI 35.0-39.9 without comorbidity)   . Spinal stenosis in cervical region   . Vitamin B12 deficiency    injestions monthly     Physical Exam: General: The patient is alert and oriented x3 in no acute distress.  Dermatology: Skin is warm, dry and supple bilateral lower extremities. Negative for open lesions or macerations.  Hyperkeratotic dystrophic nail noted to the distal tip of the right hallux nail plate with some darkening/discoloration  Vascular: Palpable pedal pulses bilaterally. No edema or erythema noted. Capillary refill within normal limits.  Neurological: Epicritic and protective threshold grossly intact bilaterally.   Musculoskeletal Exam: Range of motion within normal limits to all pedal and ankle joints bilateral. Muscle strength 5/5 in all groups bilateral.    Assessment: 1.  Onychomycosis vs dystrophic nail of toenail right great toe   Plan of Care:  1. Patient evaluated.  2. OTC tolcylen topical antifungal provided for the patient today. 3.  Recommend not applying an acrylic nail to the right hallux nail plate anymore 4.  Return to clinic as needed      Edrick Kins, DPM [facility]  Dr. Edrick Kins, DPM    [address],  [place] [address]                Office [phone]  Fax [phone]

## 2021-01-19 DIAGNOSIS — E559 Vitamin D deficiency, unspecified: Secondary | ICD-10-CM | POA: Diagnosis not present

## 2021-01-19 DIAGNOSIS — E119 Type 2 diabetes mellitus without complications: Secondary | ICD-10-CM | POA: Diagnosis not present

## 2021-01-19 DIAGNOSIS — E7849 Other hyperlipidemia: Secondary | ICD-10-CM | POA: Diagnosis not present

## 2021-01-19 DIAGNOSIS — E782 Mixed hyperlipidemia: Secondary | ICD-10-CM | POA: Diagnosis not present

## 2021-01-19 DIAGNOSIS — D519 Vitamin B12 deficiency anemia, unspecified: Secondary | ICD-10-CM | POA: Diagnosis not present

## 2021-01-26 DIAGNOSIS — M8580 Other specified disorders of bone density and structure, unspecified site: Secondary | ICD-10-CM | POA: Diagnosis not present

## 2021-01-26 DIAGNOSIS — G9009 Other idiopathic peripheral autonomic neuropathy: Secondary | ICD-10-CM | POA: Diagnosis not present

## 2021-01-26 DIAGNOSIS — M25512 Pain in left shoulder: Secondary | ICD-10-CM | POA: Diagnosis not present

## 2021-01-26 DIAGNOSIS — K219 Gastro-esophageal reflux disease without esophagitis: Secondary | ICD-10-CM | POA: Diagnosis not present

## 2021-01-26 DIAGNOSIS — E782 Mixed hyperlipidemia: Secondary | ICD-10-CM | POA: Diagnosis not present

## 2021-01-26 DIAGNOSIS — G43009 Migraine without aura, not intractable, without status migrainosus: Secondary | ICD-10-CM | POA: Diagnosis not present

## 2021-01-26 DIAGNOSIS — E1165 Type 2 diabetes mellitus with hyperglycemia: Secondary | ICD-10-CM | POA: Diagnosis not present

## 2021-01-26 DIAGNOSIS — Z6837 Body mass index (BMI) 37.0-37.9, adult: Secondary | ICD-10-CM | POA: Diagnosis not present

## 2021-01-26 DIAGNOSIS — F331 Major depressive disorder, recurrent, moderate: Secondary | ICD-10-CM | POA: Diagnosis not present

## 2021-01-26 DIAGNOSIS — E559 Vitamin D deficiency, unspecified: Secondary | ICD-10-CM | POA: Diagnosis not present

## 2021-01-26 DIAGNOSIS — F419 Anxiety disorder, unspecified: Secondary | ICD-10-CM | POA: Diagnosis not present

## 2021-01-27 ENCOUNTER — Other Ambulatory Visit (HOSPITAL_COMMUNITY): Payer: Self-pay | Admitting: Internal Medicine

## 2021-01-27 DIAGNOSIS — Z13820 Encounter for screening for osteoporosis: Secondary | ICD-10-CM

## 2021-01-27 DIAGNOSIS — Z1231 Encounter for screening mammogram for malignant neoplasm of breast: Secondary | ICD-10-CM

## 2021-03-03 DIAGNOSIS — Z1231 Encounter for screening mammogram for malignant neoplasm of breast: Secondary | ICD-10-CM | POA: Diagnosis not present

## 2021-03-04 DIAGNOSIS — M85851 Other specified disorders of bone density and structure, right thigh: Secondary | ICD-10-CM | POA: Diagnosis not present

## 2021-03-04 DIAGNOSIS — M85852 Other specified disorders of bone density and structure, left thigh: Secondary | ICD-10-CM | POA: Diagnosis not present

## 2021-05-05 DIAGNOSIS — D519 Vitamin B12 deficiency anemia, unspecified: Secondary | ICD-10-CM | POA: Diagnosis not present

## 2021-05-05 DIAGNOSIS — E119 Type 2 diabetes mellitus without complications: Secondary | ICD-10-CM | POA: Diagnosis not present

## 2021-05-10 DIAGNOSIS — F419 Anxiety disorder, unspecified: Secondary | ICD-10-CM | POA: Diagnosis not present

## 2021-05-10 DIAGNOSIS — K219 Gastro-esophageal reflux disease without esophagitis: Secondary | ICD-10-CM | POA: Diagnosis not present

## 2021-05-10 DIAGNOSIS — F329 Major depressive disorder, single episode, unspecified: Secondary | ICD-10-CM | POA: Diagnosis not present

## 2021-05-10 DIAGNOSIS — E782 Mixed hyperlipidemia: Secondary | ICD-10-CM | POA: Diagnosis not present

## 2021-05-10 DIAGNOSIS — G609 Hereditary and idiopathic neuropathy, unspecified: Secondary | ICD-10-CM | POA: Diagnosis not present

## 2021-05-10 DIAGNOSIS — N390 Urinary tract infection, site not specified: Secondary | ICD-10-CM | POA: Diagnosis not present

## 2021-05-10 DIAGNOSIS — E538 Deficiency of other specified B group vitamins: Secondary | ICD-10-CM | POA: Diagnosis not present

## 2021-05-10 DIAGNOSIS — G43909 Migraine, unspecified, not intractable, without status migrainosus: Secondary | ICD-10-CM | POA: Diagnosis not present

## 2021-05-10 DIAGNOSIS — R309 Painful micturition, unspecified: Secondary | ICD-10-CM | POA: Diagnosis not present

## 2021-05-10 DIAGNOSIS — M25512 Pain in left shoulder: Secondary | ICD-10-CM | POA: Diagnosis not present

## 2021-05-10 DIAGNOSIS — E1165 Type 2 diabetes mellitus with hyperglycemia: Secondary | ICD-10-CM | POA: Diagnosis not present

## 2021-05-10 DIAGNOSIS — M8580 Other specified disorders of bone density and structure, unspecified site: Secondary | ICD-10-CM | POA: Diagnosis not present

## 2021-08-11 DIAGNOSIS — Z0001 Encounter for general adult medical examination with abnormal findings: Secondary | ICD-10-CM | POA: Diagnosis not present

## 2021-08-11 DIAGNOSIS — E669 Obesity, unspecified: Secondary | ICD-10-CM | POA: Diagnosis not present

## 2021-08-11 DIAGNOSIS — E1165 Type 2 diabetes mellitus with hyperglycemia: Secondary | ICD-10-CM | POA: Diagnosis not present

## 2021-08-11 DIAGNOSIS — Z23 Encounter for immunization: Secondary | ICD-10-CM | POA: Diagnosis not present

## 2021-08-11 DIAGNOSIS — I10 Essential (primary) hypertension: Secondary | ICD-10-CM | POA: Diagnosis not present

## 2021-08-15 DIAGNOSIS — J069 Acute upper respiratory infection, unspecified: Secondary | ICD-10-CM | POA: Diagnosis not present

## 2021-08-15 DIAGNOSIS — J029 Acute pharyngitis, unspecified: Secondary | ICD-10-CM | POA: Diagnosis not present

## 2021-08-15 DIAGNOSIS — R067 Sneezing: Secondary | ICD-10-CM | POA: Diagnosis not present

## 2021-08-15 DIAGNOSIS — R059 Cough, unspecified: Secondary | ICD-10-CM | POA: Diagnosis not present

## 2021-09-08 DIAGNOSIS — E1165 Type 2 diabetes mellitus with hyperglycemia: Secondary | ICD-10-CM | POA: Diagnosis not present

## 2021-10-12 DIAGNOSIS — R3129 Other microscopic hematuria: Secondary | ICD-10-CM | POA: Diagnosis not present

## 2021-10-12 DIAGNOSIS — N390 Urinary tract infection, site not specified: Secondary | ICD-10-CM | POA: Diagnosis not present

## 2022-03-09 DIAGNOSIS — Z1231 Encounter for screening mammogram for malignant neoplasm of breast: Secondary | ICD-10-CM | POA: Diagnosis not present

## 2022-03-23 DIAGNOSIS — E538 Deficiency of other specified B group vitamins: Secondary | ICD-10-CM | POA: Diagnosis not present

## 2022-03-23 DIAGNOSIS — M79605 Pain in left leg: Secondary | ICD-10-CM | POA: Diagnosis not present

## 2022-03-23 DIAGNOSIS — E1165 Type 2 diabetes mellitus with hyperglycemia: Secondary | ICD-10-CM | POA: Diagnosis not present

## 2022-04-21 DIAGNOSIS — M170 Bilateral primary osteoarthritis of knee: Secondary | ICD-10-CM | POA: Diagnosis not present

## 2022-05-09 DIAGNOSIS — E538 Deficiency of other specified B group vitamins: Secondary | ICD-10-CM | POA: Diagnosis not present

## 2022-05-09 DIAGNOSIS — E1165 Type 2 diabetes mellitus with hyperglycemia: Secondary | ICD-10-CM | POA: Diagnosis not present

## 2022-05-16 DIAGNOSIS — M8580 Other specified disorders of bone density and structure, unspecified site: Secondary | ICD-10-CM | POA: Diagnosis not present

## 2022-05-16 DIAGNOSIS — M25512 Pain in left shoulder: Secondary | ICD-10-CM | POA: Diagnosis not present

## 2022-05-16 DIAGNOSIS — R350 Frequency of micturition: Secondary | ICD-10-CM | POA: Diagnosis not present

## 2022-05-16 DIAGNOSIS — E782 Mixed hyperlipidemia: Secondary | ICD-10-CM | POA: Diagnosis not present

## 2022-05-16 DIAGNOSIS — K219 Gastro-esophageal reflux disease without esophagitis: Secondary | ICD-10-CM | POA: Diagnosis not present

## 2022-05-16 DIAGNOSIS — Z0001 Encounter for general adult medical examination with abnormal findings: Secondary | ICD-10-CM | POA: Diagnosis not present

## 2022-05-16 DIAGNOSIS — E538 Deficiency of other specified B group vitamins: Secondary | ICD-10-CM | POA: Diagnosis not present

## 2022-05-18 DIAGNOSIS — M1712 Unilateral primary osteoarthritis, left knee: Secondary | ICD-10-CM | POA: Diagnosis not present

## 2022-07-17 DIAGNOSIS — G43909 Migraine, unspecified, not intractable, without status migrainosus: Secondary | ICD-10-CM | POA: Diagnosis not present

## 2022-07-17 DIAGNOSIS — Z23 Encounter for immunization: Secondary | ICD-10-CM | POA: Diagnosis not present

## 2022-07-17 DIAGNOSIS — R252 Cramp and spasm: Secondary | ICD-10-CM | POA: Diagnosis not present

## 2022-07-17 DIAGNOSIS — E119 Type 2 diabetes mellitus without complications: Secondary | ICD-10-CM | POA: Diagnosis not present

## 2022-09-14 DIAGNOSIS — M1712 Unilateral primary osteoarthritis, left knee: Secondary | ICD-10-CM | POA: Diagnosis not present

## 2022-09-20 DIAGNOSIS — M1712 Unilateral primary osteoarthritis, left knee: Secondary | ICD-10-CM | POA: Diagnosis not present

## 2022-09-28 DIAGNOSIS — M1712 Unilateral primary osteoarthritis, left knee: Secondary | ICD-10-CM | POA: Diagnosis not present

## 2022-11-10 DIAGNOSIS — E538 Deficiency of other specified B group vitamins: Secondary | ICD-10-CM | POA: Diagnosis not present

## 2022-11-10 DIAGNOSIS — E1165 Type 2 diabetes mellitus with hyperglycemia: Secondary | ICD-10-CM | POA: Diagnosis not present

## 2022-11-15 ENCOUNTER — Encounter: Payer: Self-pay | Admitting: Internal Medicine

## 2022-11-16 DIAGNOSIS — M8580 Other specified disorders of bone density and structure, unspecified site: Secondary | ICD-10-CM | POA: Diagnosis not present

## 2022-11-16 DIAGNOSIS — E559 Vitamin D deficiency, unspecified: Secondary | ICD-10-CM | POA: Diagnosis not present

## 2022-11-16 DIAGNOSIS — G43909 Migraine, unspecified, not intractable, without status migrainosus: Secondary | ICD-10-CM | POA: Diagnosis not present

## 2022-11-16 DIAGNOSIS — K219 Gastro-esophageal reflux disease without esophagitis: Secondary | ICD-10-CM | POA: Diagnosis not present

## 2022-11-16 DIAGNOSIS — E538 Deficiency of other specified B group vitamins: Secondary | ICD-10-CM | POA: Diagnosis not present

## 2022-11-16 DIAGNOSIS — E782 Mixed hyperlipidemia: Secondary | ICD-10-CM | POA: Diagnosis not present

## 2022-11-16 DIAGNOSIS — Z0000 Encounter for general adult medical examination without abnormal findings: Secondary | ICD-10-CM | POA: Diagnosis not present

## 2022-11-16 DIAGNOSIS — M25512 Pain in left shoulder: Secondary | ICD-10-CM | POA: Diagnosis not present

## 2022-11-16 DIAGNOSIS — E1165 Type 2 diabetes mellitus with hyperglycemia: Secondary | ICD-10-CM | POA: Diagnosis not present

## 2023-02-13 DIAGNOSIS — L299 Pruritus, unspecified: Secondary | ICD-10-CM | POA: Diagnosis not present

## 2023-02-13 DIAGNOSIS — R300 Dysuria: Secondary | ICD-10-CM | POA: Diagnosis not present

## 2023-02-13 DIAGNOSIS — L282 Other prurigo: Secondary | ICD-10-CM | POA: Diagnosis not present

## 2023-03-10 DIAGNOSIS — J309 Allergic rhinitis, unspecified: Secondary | ICD-10-CM | POA: Diagnosis not present

## 2023-03-15 DIAGNOSIS — Z1231 Encounter for screening mammogram for malignant neoplasm of breast: Secondary | ICD-10-CM | POA: Diagnosis not present

## 2023-03-15 DIAGNOSIS — M810 Age-related osteoporosis without current pathological fracture: Secondary | ICD-10-CM | POA: Diagnosis not present

## 2023-03-15 DIAGNOSIS — E559 Vitamin D deficiency, unspecified: Secondary | ICD-10-CM | POA: Diagnosis not present

## 2023-05-11 DIAGNOSIS — E538 Deficiency of other specified B group vitamins: Secondary | ICD-10-CM | POA: Diagnosis not present

## 2023-05-11 DIAGNOSIS — E1165 Type 2 diabetes mellitus with hyperglycemia: Secondary | ICD-10-CM | POA: Diagnosis not present

## 2023-05-17 DIAGNOSIS — M25512 Pain in left shoulder: Secondary | ICD-10-CM | POA: Diagnosis not present

## 2023-05-17 DIAGNOSIS — E782 Mixed hyperlipidemia: Secondary | ICD-10-CM | POA: Diagnosis not present

## 2023-05-17 DIAGNOSIS — E538 Deficiency of other specified B group vitamins: Secondary | ICD-10-CM | POA: Diagnosis not present

## 2023-05-17 DIAGNOSIS — G43909 Migraine, unspecified, not intractable, without status migrainosus: Secondary | ICD-10-CM | POA: Diagnosis not present

## 2023-05-17 DIAGNOSIS — E559 Vitamin D deficiency, unspecified: Secondary | ICD-10-CM | POA: Diagnosis not present

## 2023-05-17 DIAGNOSIS — K219 Gastro-esophageal reflux disease without esophagitis: Secondary | ICD-10-CM | POA: Diagnosis not present

## 2023-05-17 DIAGNOSIS — M8580 Other specified disorders of bone density and structure, unspecified site: Secondary | ICD-10-CM | POA: Diagnosis not present

## 2023-05-17 DIAGNOSIS — E1165 Type 2 diabetes mellitus with hyperglycemia: Secondary | ICD-10-CM | POA: Diagnosis not present

## 2023-09-17 DIAGNOSIS — N390 Urinary tract infection, site not specified: Secondary | ICD-10-CM | POA: Diagnosis not present

## 2023-09-17 DIAGNOSIS — E1165 Type 2 diabetes mellitus with hyperglycemia: Secondary | ICD-10-CM | POA: Diagnosis not present

## 2023-09-17 DIAGNOSIS — E782 Mixed hyperlipidemia: Secondary | ICD-10-CM | POA: Diagnosis not present

## 2023-09-17 DIAGNOSIS — Z7984 Long term (current) use of oral hypoglycemic drugs: Secondary | ICD-10-CM | POA: Diagnosis not present

## 2023-09-24 DIAGNOSIS — Z713 Dietary counseling and surveillance: Secondary | ICD-10-CM | POA: Diagnosis not present

## 2023-09-24 DIAGNOSIS — E1165 Type 2 diabetes mellitus with hyperglycemia: Secondary | ICD-10-CM | POA: Diagnosis not present

## 2023-09-24 DIAGNOSIS — E782 Mixed hyperlipidemia: Secondary | ICD-10-CM | POA: Diagnosis not present

## 2023-09-24 DIAGNOSIS — Z23 Encounter for immunization: Secondary | ICD-10-CM | POA: Diagnosis not present

## 2023-12-21 DIAGNOSIS — E538 Deficiency of other specified B group vitamins: Secondary | ICD-10-CM | POA: Diagnosis not present

## 2023-12-21 DIAGNOSIS — E1165 Type 2 diabetes mellitus with hyperglycemia: Secondary | ICD-10-CM | POA: Diagnosis not present

## 2023-12-25 DIAGNOSIS — M8580 Other specified disorders of bone density and structure, unspecified site: Secondary | ICD-10-CM | POA: Diagnosis not present

## 2023-12-25 DIAGNOSIS — R300 Dysuria: Secondary | ICD-10-CM | POA: Diagnosis not present

## 2023-12-25 DIAGNOSIS — M25512 Pain in left shoulder: Secondary | ICD-10-CM | POA: Diagnosis not present

## 2023-12-25 DIAGNOSIS — E782 Mixed hyperlipidemia: Secondary | ICD-10-CM | POA: Diagnosis not present

## 2023-12-25 DIAGNOSIS — E538 Deficiency of other specified B group vitamins: Secondary | ICD-10-CM | POA: Diagnosis not present

## 2023-12-25 DIAGNOSIS — E1165 Type 2 diabetes mellitus with hyperglycemia: Secondary | ICD-10-CM | POA: Diagnosis not present

## 2023-12-25 DIAGNOSIS — K219 Gastro-esophageal reflux disease without esophagitis: Secondary | ICD-10-CM | POA: Diagnosis not present

## 2023-12-25 DIAGNOSIS — Z23 Encounter for immunization: Secondary | ICD-10-CM | POA: Diagnosis not present

## 2023-12-25 DIAGNOSIS — G43909 Migraine, unspecified, not intractable, without status migrainosus: Secondary | ICD-10-CM | POA: Diagnosis not present

## 2023-12-25 DIAGNOSIS — Z0000 Encounter for general adult medical examination without abnormal findings: Secondary | ICD-10-CM | POA: Diagnosis not present

## 2024-01-22 DIAGNOSIS — E559 Vitamin D deficiency, unspecified: Secondary | ICD-10-CM | POA: Diagnosis not present

## 2024-01-22 DIAGNOSIS — B351 Tinea unguium: Secondary | ICD-10-CM | POA: Diagnosis not present

## 2024-01-22 DIAGNOSIS — R300 Dysuria: Secondary | ICD-10-CM | POA: Diagnosis not present

## 2024-01-24 ENCOUNTER — Encounter: Payer: Self-pay | Admitting: Internal Medicine

## 2024-02-22 ENCOUNTER — Ambulatory Visit (AMBULATORY_SURGERY_CENTER): Payer: Self-pay

## 2024-02-22 VITALS — Ht 66.0 in | Wt 202.0 lb

## 2024-02-22 DIAGNOSIS — Z860100 Personal history of colon polyps, unspecified: Secondary | ICD-10-CM

## 2024-02-22 MED ORDER — NA SULFATE-K SULFATE-MG SULF 17.5-3.13-1.6 GM/177ML PO SOLN: 1.0000 | Freq: Once | ORAL | 0 refills | Status: AC

## 2024-02-22 NOTE — Progress Notes (Signed)
 Pre visit completed via phone call; Patient verified name, DOB, and address; No egg or soy allergy known to patient;  No issues known to pt with past sedation with any surgeries or procedures; Patient denies ever being told they had issues or difficulty with intubation;  No FH of Malignant Hyperthermia; Pt is not on diet pills; Pt is not on home 02;  Pt is not on blood thinners;  Pt denies issues with constipation;  No A fib or A flutter; Have any cardiac testing pending--NO Insurance verified during PV appt--- UHC AARP Pt can ambulate without assistance;  Pt denies use of chewing tobacco; Discussed diabetic/weight loss medication holds; Discussed NSAID holds; Checked BMI to be less than 50; Pt instructed to use Singlecare.com or GoodRx for a price reduction on prep;  Patient's chart reviewed by Rogena Class CNRA prior to previsit and patient appropriate for the [facility]; Pre visit completed and red dot placed by patient's name on their procedure day (on provider's schedule);  Instructions printed and mailed to the patient per her request;

## 2024-03-12 DIAGNOSIS — N6324 Unspecified lump in the left breast, lower inner quadrant: Secondary | ICD-10-CM | POA: Diagnosis not present

## 2024-03-12 DIAGNOSIS — N3281 Overactive bladder: Secondary | ICD-10-CM | POA: Diagnosis not present

## 2024-03-18 DIAGNOSIS — D360 Benign neoplasm of lymph nodes: Secondary | ICD-10-CM | POA: Diagnosis not present

## 2024-03-18 DIAGNOSIS — N6324 Unspecified lump in the left breast, lower inner quadrant: Secondary | ICD-10-CM | POA: Diagnosis not present

## 2024-03-21 ENCOUNTER — Encounter: Payer: Self-pay | Admitting: Internal Medicine

## 2024-03-21 ENCOUNTER — Ambulatory Visit (AMBULATORY_SURGERY_CENTER): Admitting: Internal Medicine

## 2024-03-21 VITALS — BP 128/78 | HR 64 | Temp 97.5°F | Resp 16 | Ht 66.0 in | Wt 202.0 lb

## 2024-03-21 DIAGNOSIS — Z1211 Encounter for screening for malignant neoplasm of colon: Secondary | ICD-10-CM

## 2024-03-21 DIAGNOSIS — Z860100 Personal history of colon polyps, unspecified: Secondary | ICD-10-CM

## 2024-03-21 DIAGNOSIS — Z860101 Personal history of adenomatous and serrated colon polyps: Secondary | ICD-10-CM | POA: Diagnosis not present

## 2024-03-21 MED ORDER — SODIUM CHLORIDE 0.9 % IV SOLN: 500.0000 mL | Freq: Once | INTRAVENOUS | Status: DC

## 2024-03-21 NOTE — Progress Notes (Signed)
 HISTORY OF PRESENT ILLNESS:  Gina Patterson is a 73 y.o. female with a history of multiple adenomatous colon polyps presents today for surveillance colonoscopy.  REVIEW OF SYSTEMS:  All non-GI ROS negative except for  Past Medical History:  Diagnosis Date   Allergy    Anemia    pernicious anemia   Anxiety    Arthritis    Fibrocystic breast changes 07/17/12   GERD (gastroesophageal reflux disease)    Hyperlipidemia    Migraine    with Aura   Obesity (BMI 35.0-39.9 without comorbidity)    Spinal stenosis in cervical region    Vitamin B12 deficiency    injestions monthly    Past Surgical History:  Procedure Laterality Date   ABDOMINAL HYSTERECTOMY  1988   secondary to fibroids and DUB; ovaries remain   APPENDECTOMY     BREAST SURGERY Left 06/2012   fibrocystic breast changes   CESAREAN SECTION     CHOLECYSTECTOMY OPEN  1981   COLONOSCOPY  2021   JP-MAC-suprep(exc)-hems/TA-recall 3 yrs   KNEE ARTHROSCOPY Left 2005   TUBAL LIGATION      Social History Gina Patterson  reports that she has never smoked. She has never used smokeless tobacco. She reports that she does not drink alcohol and does not use drugs.  family history includes CVA (age of onset: 41) in her father; Heart attack in her brother and brother; Hypertension in her sister; Lung cancer (age of onset: 71) in her sister; Lung cancer (age of onset: 53) in her mother.  Allergies  Allergen Reactions   Bacitra-Neomycin-Polymyxin-Hc Rash   Benzalkonium Chloride Other (See Comments)    Pt unsure    Codeine Other (See Comments)    nightmares   Diazepam Other (See Comments)    "Hypes me up"   Metformin Diarrhea and Nausea And Vomiting   Misc. Sulfonamide Containing Compounds Hives, Itching, Dermatitis and Rash   Neosporin [Neomycin-Bacitracin Zn-Polymyx] Rash   Sulfa Antibiotics Hives, Itching, Dermatitis and Rash       PHYSICAL EXAMINATION: Vital signs: BP 127/70   Pulse 60   Temp (!) 97.5 F (36.4 C)  (Temporal)   Ht 5\' 6"  (1.676 m)   Wt 202 lb (91.6 kg)   LMP 07/31/1987   SpO2 99%   BMI 32.60 kg/m  General: Well-developed, well-nourished, no acute distress HEENT: Sclerae are anicteric, conjunctiva pink. Oral mucosa intact Lungs: Clear Heart: Regular Abdomen: soft, nontender, nondistended, no obvious ascites, no peritoneal signs, normal bowel sounds. No organomegaly. Extremities: No edema Psychiatric: alert and oriented x3. Cooperative     ASSESSMENT:  History of multiple adenomatous polyps   PLAN:   Surveillance colonoscopy

## 2024-03-21 NOTE — Patient Instructions (Addendum)

## 2024-03-21 NOTE — Progress Notes (Signed)
 Pt's states no medical or surgical changes since previsit or office visit.

## 2024-03-21 NOTE — Progress Notes (Signed)
 Sedate, gd SR, tolerated procedure well, VSS, report to RN

## 2024-03-21 NOTE — Op Note (Signed)
 [facility] Patient Name: Gina Patterson Procedure Date: 03/21/2024 12:09 PM MRN: [id number] Endoscopist: Murel Arlington. Elvin Hammer , MD, [id number] Age: 72 Referring MD:  Date of Birth: 02/07/1951 Gender: Female Account #: [id number] Procedure:                Colonoscopy Indications:              High risk colon cancer surveillance: Personal                            history of multiple (3 or more) adenomas. Previous                            examination 2021 Medicines:                Monitored Anesthesia Care Procedure:                Pre-Anesthesia Assessment:                           - Prior to the procedure, a History and Physical                            was performed, and patient medications and                            allergies were reviewed. The patient's tolerance of                            previous anesthesia was also reviewed. The risks                            and benefits of the procedure and the sedation                            options and risks were discussed with the patient.                            All questions were answered, and informed consent                            was obtained. Prior Anticoagulants: The patient has                            taken no anticoagulant or antiplatelet agents. ASA                            Grade Assessment: II - A patient with mild systemic                            disease. After reviewing the risks and benefits,                            the patient was deemed in satisfactory condition to  undergo the procedure.                           After obtaining informed consent, the colonoscope                            was passed under direct vision. Throughout the                            procedure, the patient's blood pressure, pulse, and                            oxygen saturations were monitored continuously. The                            Olympus Scope [id number] was introduced  through the                            anus and advanced to the the cecum, identified by                            appendiceal orifice and ileocecal valve. The                            ileocecal valve, appendiceal orifice, and rectum                            were photographed. The quality of the bowel                            preparation was excellent. The colonoscopy was                            performed without difficulty. The patient tolerated                            the procedure well. The bowel preparation used was                            SUPREP via split dose instruction. Scope In: 12:18:55 PM Scope Out: 12:31:56 PM Scope Withdrawal Time: 0 hours 9 minutes 5 seconds  Total Procedure Duration: 0 hours 13 minutes 1 second  Findings:                 The entire examined colon appeared normal on direct                            and retroflexion views. Complications:            No immediate complications. Estimated blood loss:                            None. Estimated Blood Loss:     Estimated blood loss: none. Impression:               - The entire examined colon  is normal on direct and                            retroflexion views.                           - No specimens collected. Recommendation:           - Repeat colonoscopy is not recommended for                            surveillance.                           - Patient has a contact number available for                            emergencies. The signs and symptoms of potential                            delayed complications were discussed with the                            patient. Return to normal activities tomorrow.                            Written discharge instructions were provided to the                            patient.                           - Resume previous diet.                           - Continue present medications. Murel Arlington. Elvin Hammer, MD 03/21/2024 12:35:46 PM This report has been  signed electronically.

## 2024-03-25 ENCOUNTER — Telehealth: Payer: Self-pay | Admitting: *Deleted

## 2024-03-25 NOTE — Telephone Encounter (Signed)
  Follow up Call-     03/21/2024   11:38 AM  Call back number  Post procedure Call Back phone  # [phone]  Permission to leave phone message Yes     Patient questions:  Message left to call us  if necessary.

## 2024-03-31 DIAGNOSIS — I10 Essential (primary) hypertension: Secondary | ICD-10-CM | POA: Diagnosis not present

## 2024-03-31 DIAGNOSIS — E119 Type 2 diabetes mellitus without complications: Secondary | ICD-10-CM | POA: Diagnosis not present

## 2024-04-03 DIAGNOSIS — E1165 Type 2 diabetes mellitus with hyperglycemia: Secondary | ICD-10-CM | POA: Diagnosis not present

## 2024-04-03 DIAGNOSIS — E538 Deficiency of other specified B group vitamins: Secondary | ICD-10-CM | POA: Diagnosis not present

## 2024-04-03 DIAGNOSIS — G43909 Migraine, unspecified, not intractable, without status migrainosus: Secondary | ICD-10-CM | POA: Diagnosis not present

## 2024-04-03 DIAGNOSIS — L299 Pruritus, unspecified: Secondary | ICD-10-CM | POA: Diagnosis not present

## 2024-04-03 DIAGNOSIS — E559 Vitamin D deficiency, unspecified: Secondary | ICD-10-CM | POA: Diagnosis not present

## 2024-04-03 DIAGNOSIS — B351 Tinea unguium: Secondary | ICD-10-CM | POA: Diagnosis not present

## 2024-04-03 DIAGNOSIS — K219 Gastro-esophageal reflux disease without esophagitis: Secondary | ICD-10-CM | POA: Diagnosis not present

## 2024-04-03 DIAGNOSIS — E782 Mixed hyperlipidemia: Secondary | ICD-10-CM | POA: Diagnosis not present

## 2024-04-03 DIAGNOSIS — M25512 Pain in left shoulder: Secondary | ICD-10-CM | POA: Diagnosis not present

## 2024-05-06 DIAGNOSIS — Z1211 Encounter for screening for malignant neoplasm of colon: Secondary | ICD-10-CM | POA: Diagnosis not present

## 2024-05-06 DIAGNOSIS — R5381 Other malaise: Secondary | ICD-10-CM | POA: Diagnosis not present

## 2024-05-06 DIAGNOSIS — R5383 Other fatigue: Secondary | ICD-10-CM | POA: Diagnosis not present

## 2024-05-06 DIAGNOSIS — E559 Vitamin D deficiency, unspecified: Secondary | ICD-10-CM | POA: Diagnosis not present

## 2024-05-06 DIAGNOSIS — E538 Deficiency of other specified B group vitamins: Secondary | ICD-10-CM | POA: Diagnosis not present

## 2024-05-06 DIAGNOSIS — B351 Tinea unguium: Secondary | ICD-10-CM | POA: Diagnosis not present

## 2024-05-06 DIAGNOSIS — Z8719 Personal history of other diseases of the digestive system: Secondary | ICD-10-CM | POA: Diagnosis not present

## 2024-07-29 DIAGNOSIS — E782 Mixed hyperlipidemia: Secondary | ICD-10-CM | POA: Diagnosis not present

## 2024-07-29 DIAGNOSIS — E538 Deficiency of other specified B group vitamins: Secondary | ICD-10-CM | POA: Diagnosis not present

## 2024-08-04 DIAGNOSIS — T466X5A Adverse effect of antihyperlipidemic and antiarteriosclerotic drugs, initial encounter: Secondary | ICD-10-CM | POA: Diagnosis not present

## 2024-08-04 DIAGNOSIS — E1165 Type 2 diabetes mellitus with hyperglycemia: Secondary | ICD-10-CM | POA: Diagnosis not present

## 2024-08-04 DIAGNOSIS — Z23 Encounter for immunization: Secondary | ICD-10-CM | POA: Diagnosis not present

## 2024-08-04 DIAGNOSIS — Z0000 Encounter for general adult medical examination without abnormal findings: Secondary | ICD-10-CM | POA: Diagnosis not present

## 2024-08-04 DIAGNOSIS — E782 Mixed hyperlipidemia: Secondary | ICD-10-CM | POA: Diagnosis not present

## 2024-08-04 DIAGNOSIS — G720 Drug-induced myopathy: Secondary | ICD-10-CM | POA: Diagnosis not present

## 2024-08-04 DIAGNOSIS — B351 Tinea unguium: Secondary | ICD-10-CM | POA: Diagnosis not present

## 2024-08-04 DIAGNOSIS — Z0001 Encounter for general adult medical examination with abnormal findings: Secondary | ICD-10-CM | POA: Diagnosis not present

## 2024-08-04 DIAGNOSIS — G43909 Migraine, unspecified, not intractable, without status migrainosus: Secondary | ICD-10-CM | POA: Diagnosis not present

## 2024-08-04 DIAGNOSIS — K219 Gastro-esophageal reflux disease without esophagitis: Secondary | ICD-10-CM | POA: Diagnosis not present

## 2024-08-13 ENCOUNTER — Ambulatory Visit: Admitting: Podiatry

## 2024-08-13 ENCOUNTER — Encounter: Payer: Self-pay | Admitting: Podiatry

## 2024-08-13 VITALS — Ht 66.0 in | Wt 202.0 lb

## 2024-08-13 DIAGNOSIS — L600 Ingrowing nail: Secondary | ICD-10-CM | POA: Diagnosis not present

## 2024-08-13 NOTE — Progress Notes (Signed)
   Chief Complaint  Patient presents with   Nail Problem    Pt is here due to toenail fungus on the left great toenail.    HPI: 73 y.o. female presenting today for evaluation of a thickened dystrophic nail to the left hallux nail plate with associated tenderness.  Past Medical History:  Diagnosis Date   Allergy    Anemia    pernicious anemia   Anxiety    Arthritis    Fibrocystic breast changes 07/17/12   GERD (gastroesophageal reflux disease)    Hyperlipidemia    Migraine    with Aura   Obesity (BMI 35.0-39.9 without comorbidity)    Spinal stenosis in cervical region    Vitamin B12 deficiency    injestions monthly    Past Surgical History:  Procedure Laterality Date   ABDOMINAL HYSTERECTOMY  1988   secondary to fibroids and DUB; ovaries remain   APPENDECTOMY     BREAST SURGERY Left 06/2012   fibrocystic breast changes   CESAREAN SECTION     CHOLECYSTECTOMY OPEN  1981   COLONOSCOPY  2021   JP-MAC-suprep(exc)-hems/TA-recall 3 yrs   KNEE ARTHROSCOPY Left 2005   TUBAL LIGATION      Allergies  Allergen Reactions   Bacitra-Neomycin-Polymyxin-Hc Rash   Benzalkonium Chloride Other (See Comments)    Pt unsure    Codeine Other (See Comments)    nightmares   Diazepam Other (See Comments)    Hypes me up   Metformin Diarrhea and Nausea And Vomiting   Misc. Sulfonamide Containing Compounds Hives, Itching, Dermatitis and Rash   Neosporin [Neomycin-Bacitracin Zn-Polymyx] Rash   Sulfa Antibiotics Hives, Itching, Dermatitis and Rash     Physical Exam: General: The patient is alert and oriented x3 in no acute distress.  Dermatology: Skin is warm, dry and supple bilateral lower extremities.  Hyperkeratotic dystrophic nail noted to the left hallux nail plate  Vascular: Palpable pedal pulses bilaterally. Capillary refill within normal limits.  No appreciable edema.  No erythema.  Neurological: Grossly intact via light touch  Musculoskeletal Exam: No pedal deformities  noted   Assessment/Plan of Care: 1.  Symptomatic painful onychomycosis of toenail left hallux nail plate  -Patient evaluated -She has recently completed a 990-day prescription of Lamisil -She continues to apply ciclopirox without improvement -Both her nurse practitioner and patient would like to have the nail avulsed and allow new nail to grow again.  This was discussed in detail with the patient and she is amenable to this plan -Toes prepped in aseptic manner and digital block performed using 3 mL of 2% lidocaine plain.  The nail was avulsed in its entirety.  Dressings applied.  Post care instructions provided -Return to clinic PRN       Thresa EMERSON Sar, DPM [facility]  Dr. Thresa EMERSON Sar, DPM    [address] [place], [place] [address]                Office [phone]  Fax [phone]

## 2024-12-05 ENCOUNTER — Ambulatory Visit: Admitting: Nurse Practitioner

## 2024-12-29 ENCOUNTER — Ambulatory Visit: Admitting: Nurse Practitioner
